# Patient Record
Sex: Female | Born: 1940 | Race: Black or African American | Hispanic: No | Smoking: Former smoker
Health system: Southern US, Community
[De-identification: ages and names within clinical notes are randomized; demographics above are authoritative.]

## PROBLEM LIST (undated history)

## (undated) DIAGNOSIS — Z972 Presence of dental prosthetic device (complete) (partial): Secondary | ICD-10-CM

## (undated) DIAGNOSIS — R519 Headache, unspecified: Secondary | ICD-10-CM

## (undated) DIAGNOSIS — Z9889 Other specified postprocedural states: Secondary | ICD-10-CM

## (undated) DIAGNOSIS — I10 Essential (primary) hypertension: Secondary | ICD-10-CM

## (undated) DIAGNOSIS — R112 Nausea with vomiting, unspecified: Secondary | ICD-10-CM

## (undated) DIAGNOSIS — E785 Hyperlipidemia, unspecified: Secondary | ICD-10-CM

## (undated) DIAGNOSIS — M1990 Unspecified osteoarthritis, unspecified site: Secondary | ICD-10-CM

## (undated) HISTORY — PX: COLONOSCOPY: SHX174

## (undated) HISTORY — PX: CATARACT EXTRACTION: SUR2

## (undated) HISTORY — DX: Hypercalcemia: E83.52

## (undated) HISTORY — PX: ABDOMINAL HYSTERECTOMY: SHX81

---

## 2017-04-29 ENCOUNTER — Other Ambulatory Visit: Payer: Self-pay | Admitting: Internal Medicine

## 2017-04-29 DIAGNOSIS — Z1231 Encounter for screening mammogram for malignant neoplasm of breast: Secondary | ICD-10-CM

## 2017-05-14 ENCOUNTER — Ambulatory Visit
Admission: RE | Admit: 2017-05-14 | Discharge: 2017-05-14 | Disposition: A | Discharge status: Home / Self Care | Payer: Medicare HMO | Source: Ambulatory Visit | Attending: Internal Medicine | Admitting: Internal Medicine

## 2017-05-14 DIAGNOSIS — Z1231 Encounter for screening mammogram for malignant neoplasm of breast: Secondary | ICD-10-CM | POA: Insufficient documentation

## 2017-06-12 ENCOUNTER — Other Ambulatory Visit: Payer: Self-pay | Admitting: *Deleted

## 2017-06-12 ENCOUNTER — Inpatient Hospital Stay
Admission: RE | Admit: 2017-06-12 | Discharge: 2017-06-12 | Disposition: A | Discharge status: Home / Self Care | Payer: Self-pay | Source: Ambulatory Visit | Attending: *Deleted | Admitting: *Deleted

## 2017-06-12 DIAGNOSIS — Z9289 Personal history of other medical treatment: Secondary | ICD-10-CM

## 2018-03-05 ENCOUNTER — Other Ambulatory Visit: Payer: Self-pay | Admitting: Internal Medicine

## 2018-03-05 DIAGNOSIS — Z1231 Encounter for screening mammogram for malignant neoplasm of breast: Secondary | ICD-10-CM

## 2018-08-06 ENCOUNTER — Ambulatory Visit
Admission: RE | Admit: 2018-08-06 | Discharge: 2018-08-06 | Disposition: A | Discharge status: Home / Self Care | Payer: Medicare HMO | Source: Ambulatory Visit | Attending: Internal Medicine | Admitting: Internal Medicine

## 2018-08-06 DIAGNOSIS — Z1231 Encounter for screening mammogram for malignant neoplasm of breast: Secondary | ICD-10-CM | POA: Diagnosis present

## 2019-09-21 ENCOUNTER — Other Ambulatory Visit: Payer: Self-pay | Admitting: Internal Medicine

## 2019-09-21 DIAGNOSIS — Z1231 Encounter for screening mammogram for malignant neoplasm of breast: Secondary | ICD-10-CM

## 2019-10-09 ENCOUNTER — Ambulatory Visit
Admission: RE | Admit: 2019-10-09 | Discharge: 2019-10-09 | Disposition: A | Discharge status: Home / Self Care | Payer: Medicare HMO | Source: Ambulatory Visit | Attending: Internal Medicine | Admitting: Internal Medicine

## 2019-10-09 DIAGNOSIS — Z1231 Encounter for screening mammogram for malignant neoplasm of breast: Secondary | ICD-10-CM | POA: Diagnosis not present

## 2020-02-29 ENCOUNTER — Other Ambulatory Visit: Payer: Self-pay

## 2020-02-29 ENCOUNTER — Encounter: Payer: Self-pay | Admitting: Ophthalmology

## 2020-03-01 LAB — EXTERNAL GENERIC LAB PROCEDURE: COLOGUARD: NEGATIVE

## 2020-03-01 LAB — COLOGUARD: COLOGUARD: NEGATIVE

## 2020-03-02 ENCOUNTER — Other Ambulatory Visit
Admission: RE | Admit: 2020-03-02 | Discharge: 2020-03-02 | Disposition: A | Discharge status: Home / Self Care | Payer: Medicare HMO | Source: Ambulatory Visit | Attending: Ophthalmology | Admitting: Ophthalmology

## 2020-03-02 ENCOUNTER — Other Ambulatory Visit: Payer: Self-pay

## 2020-03-02 DIAGNOSIS — Z20822 Contact with and (suspected) exposure to covid-19: Secondary | ICD-10-CM | POA: Insufficient documentation

## 2020-03-02 DIAGNOSIS — Z01812 Encounter for preprocedural laboratory examination: Secondary | ICD-10-CM | POA: Insufficient documentation

## 2020-03-02 LAB — SARS CORONAVIRUS 2 (TAT 6-24 HRS): SARS Coronavirus 2: NEGATIVE

## 2020-03-02 NOTE — Discharge Instructions (Signed)
INSTRUCTIONS FOLLOWING OCULOPLASTIC SURGERY AMY M. FOWLER, MD  AFTER YOUR EYE SURGERY, THER ARE MANY THINGS WHICH YOU, THE PATIENT, CAN DO TO ASSURE THE BEST POSSIBLE RESULT FROM YOUR OPERATION.  THIS SHEET SHOULD BE REFERRED TO WHENEVER QUESTIONS ARISE.  IF THERE ARE ANY QUESTIONS NOT ANSWERED HERE, DO NOT HESITATE TO CALL OUR OFFICE AT [phone] OR [phone].  THERE IS ALWAYS SOMEONE AVAILABLE TO CALL IF QUESTIONS OR PROBLEMS ARISE.  VISION: Your vision may be blurred and out of focus after surgery until you are able to stop using your ointment, swelling resolves and your eye(s) heal. This may take 1 to 2 weeks at the least.  If your vision becomes gradually more dim or dark, this is not normal and you need to call our office immediately.  EYE CARE: For the first 48 hours after surgery, use ice packs frequently - "20 minutes on, 20 minutes off" - to help reduce swelling and bruising.  Small bags of frozen peas or corn make good ice packs along with cloths soaked in ice water.  If you are wearing a patch or other type of dressing following surgery, keep this on for the amount of time specified by your doctor.  For the first week following surgery, you will need to treat your stitches with great care.  It is OK to shower, but take care to not allow soapy water to run into your eye(s) to help reduce chances of infection.  You may gently clean the eyelashes and around the eye(s) with cotton balls and sterile water, BUT DO NOT RUB THE STITCHES VIGOROUSLY.  Keeping your stitches moist with ointment will help promote healing with minimal scar formation.  ACTIVITY: When you leave the surgery center, you should go home, rest and be inactive.  The eye(s) may feel scratchy and keeping the eyes closed will allow for faster healing.  The first week following surgery, avoid straining (anything making the face turn red) or lifting over 20 pounds.  Additionally, avoid bending which causes your head to go below  your waist.  Using your eyes will NOT harm them, so feel free to read, watch television, use the computer, etc as desired.  Driving depends on each individual, so check with your doctor if you have questions about driving. Do not wear contact lenses for about 2 weeks.  Do not wear eye makeup for 2 weeks.  Avoid swimming, hot tubs, gardening, and dusting for 1 to 2 weeks to reduce the risk of an infection.  MEDICATIONS:  You will be given a prescription for an ointment to use 4 times a day on your stitches.  You can use the ointment in your eyes if they feel scratchy or irritated.  If you eyelid(s) don't close completely when you sleep, put some ointment in your eyes before bedtime.  EMERGENCY: If you experience SEVERE EYE PAIN OR HEADACHE UNRELIEVED BY TYLENOL OR TRAMADOL, NAUSEA OR VOMITING, WORSENING REDNESS, OR WORSENING VISION (ESPECIALLY VISION THAT WAS INITIALLY BETTER) CALL [phone] OR [phone] DURING BUSINESS HOURS OR AFTER HOURS.  General Anesthesia, Adult, Care After This sheet gives you information about how to care for yourself after your procedure. Your health care provider may also give you more specific instructions. If you have problems or questions, contact your health care provider. What can I expect after the procedure? After the procedure, the following side effects are common:  Pain or discomfort at the IV site.  Nausea.  Vomiting.  Sore throat.  Trouble concentrating.    Feeling cold or chills.  Weak or tired.  Sleepiness and fatigue.  Soreness and body aches. These side effects can affect parts of the body that were not involved in surgery. Follow these instructions at home:  For at least 24 hours after the procedure:  Have a responsible adult stay with you. It is important to have someone help care for you until you are awake and alert.  Rest as needed.  Do not: ? Participate in activities in which you could fall or become injured. ? Drive. ? Use  heavy machinery. ? Drink alcohol. ? Take sleeping pills or medicines that cause drowsiness. ? Make important decisions or sign legal documents. ? Take care of children on your own. Eating and drinking  Follow any instructions from your health care provider about eating or drinking restrictions.  When you feel hungry, start by eating small amounts of foods that are soft and easy to digest (bland), such as toast. Gradually return to your regular diet.  Drink enough fluid to keep your urine pale yellow.  If you vomit, rehydrate by drinking water, juice, or clear broth. General instructions  If you have sleep apnea, surgery and certain medicines can increase your risk for breathing problems. Follow instructions from your health care provider about wearing your sleep device: ? Anytime you are sleeping, including during daytime naps. ? While taking prescription pain medicines, sleeping medicines, or medicines that make you drowsy.  Return to your normal activities as told by your health care provider. Ask your health care provider what activities are safe for you.  Take over-the-counter and prescription medicines only as told by your health care provider.  If you smoke, do not smoke without supervision.  Keep all follow-up visits as told by your health care provider. This is important. Contact a health care provider if:  You have nausea or vomiting that does not get better with medicine.  You cannot eat or drink without vomiting.  You have pain that does not get better with medicine.  You are unable to pass urine.  You develop a skin rash.  You have a fever.  You have redness around your IV site that gets worse. Get help right away if:  You have difficulty breathing.  You have chest pain.  You have blood in your urine or stool, or you vomit blood. Summary  After the procedure, it is common to have a sore throat or nausea. It is also common to feel tired.  Have a  responsible adult stay with you for the first 24 hours after general anesthesia. It is important to have someone help care for you until you are awake and alert.  When you feel hungry, start by eating small amounts of foods that are soft and easy to digest (bland), such as toast. Gradually return to your regular diet.  Drink enough fluid to keep your urine pale yellow.  Return to your normal activities as told by your health care provider. Ask your health care provider what activities are safe for you. This information is not intended to replace advice given to you by your health care provider. Make sure you discuss any questions you have with your health care provider. Document Revised: 07/05/2017 Document Reviewed: 02/15/2017 Elsevier Patient Education  2020 [employer]  

## 2020-03-04 ENCOUNTER — Ambulatory Visit: Payer: Medicare HMO | Admitting: Anesthesiology

## 2020-03-04 ENCOUNTER — Encounter
Admission: RE | Disposition: A | Discharge status: Home / Self Care | Payer: Self-pay | Source: Home / Self Care | Attending: Ophthalmology

## 2020-03-04 ENCOUNTER — Encounter: Payer: Self-pay | Admitting: Ophthalmology

## 2020-03-04 ENCOUNTER — Ambulatory Visit
Admission: RE | Admit: 2020-03-04 | Discharge: 2020-03-04 | Disposition: A | Discharge status: Home / Self Care | Payer: Medicare HMO | Attending: Ophthalmology | Admitting: Ophthalmology

## 2020-03-04 ENCOUNTER — Other Ambulatory Visit: Payer: Self-pay

## 2020-03-04 DIAGNOSIS — I10 Essential (primary) hypertension: Secondary | ICD-10-CM | POA: Insufficient documentation

## 2020-03-04 DIAGNOSIS — E7800 Pure hypercholesterolemia, unspecified: Secondary | ICD-10-CM | POA: Insufficient documentation

## 2020-03-04 DIAGNOSIS — H11821 Conjunctivochalasis, right eye: Secondary | ICD-10-CM | POA: Diagnosis not present

## 2020-03-04 DIAGNOSIS — Z79899 Other long term (current) drug therapy: Secondary | ICD-10-CM | POA: Insufficient documentation

## 2020-03-04 DIAGNOSIS — Z87891 Personal history of nicotine dependence: Secondary | ICD-10-CM | POA: Diagnosis not present

## 2020-03-04 DIAGNOSIS — M1990 Unspecified osteoarthritis, unspecified site: Secondary | ICD-10-CM | POA: Diagnosis not present

## 2020-03-04 DIAGNOSIS — H02102 Unspecified ectropion of right lower eyelid: Secondary | ICD-10-CM | POA: Diagnosis present

## 2020-03-04 HISTORY — DX: Unspecified osteoarthritis, unspecified site: M19.90

## 2020-03-04 HISTORY — DX: Other specified postprocedural states: Z98.890

## 2020-03-04 HISTORY — DX: Nausea with vomiting, unspecified: R11.2

## 2020-03-04 HISTORY — DX: Presence of dental prosthetic device (complete) (partial): Z97.2

## 2020-03-04 HISTORY — DX: Hyperlipidemia, unspecified: E78.5

## 2020-03-04 HISTORY — DX: Essential (primary) hypertension: I10

## 2020-03-04 HISTORY — PX: ECTROPION REPAIR: SHX357

## 2020-03-04 SURGERY — REPAIR, ECTROPION, EYELID
Anesthesia: Monitor Anesthesia Care | Site: Eye | Laterality: Right

## 2020-03-04 MED ORDER — TRAMADOL HCL 50 MG PO TABS: ORAL_TABLET | ORAL | 0 refills | Status: DC

## 2020-03-04 MED ORDER — ERYTHROMYCIN 5 MG/GM OP OINT
TOPICAL_OINTMENT | OPHTHALMIC | Status: DC | PRN
  Administered 2020-03-04: 1 via OPHTHALMIC

## 2020-03-04 MED ORDER — MOXIFLOXACIN HCL 0.5 % OP SOLN: 1.0000 [drp] | Freq: Three times a day (TID) | OPHTHALMIC | 0 refills | Status: AC

## 2020-03-04 MED ORDER — ACETAMINOPHEN 325 MG PO TABS: 325.0000 mg | ORAL_TABLET | Freq: Once | ORAL | Status: DC

## 2020-03-04 MED ORDER — ACETAMINOPHEN 160 MG/5ML PO SOLN: 325.0000 mg | Freq: Once | ORAL | Status: DC

## 2020-03-04 MED ORDER — LIDOCAINE HCL (CARDIAC) PF 100 MG/5ML IV SOSY
PREFILLED_SYRINGE | INTRAVENOUS | Status: DC | PRN
  Administered 2020-03-04: 40 mg via INTRAVENOUS

## 2020-03-04 MED ORDER — PROPOFOL 500 MG/50ML IV EMUL
INTRAVENOUS | Status: DC | PRN
  Administered 2020-03-04: 25 ug/kg/min via INTRAVENOUS

## 2020-03-04 MED ORDER — LIDOCAINE-EPINEPHRINE 2 %-1:100000 IJ SOLN
INTRAMUSCULAR | Status: DC | PRN
  Administered 2020-03-04: 1.5 mL via OPHTHALMIC
  Administered 2020-03-04: 1 mL via OPHTHALMIC

## 2020-03-04 MED ORDER — LACTATED RINGERS IV SOLN: INTRAVENOUS | Status: DC

## 2020-03-04 MED ORDER — ONDANSETRON HCL 4 MG/2ML IJ SOLN
INTRAMUSCULAR | Status: DC | PRN
  Administered 2020-03-04: 4 mg via INTRAVENOUS

## 2020-03-04 MED ORDER — ERYTHROMYCIN 5 MG/GM OP OINT: TOPICAL_OINTMENT | OPHTHALMIC | 2 refills | Status: DC

## 2020-03-04 MED ORDER — BSS IO SOLN
INTRAOCULAR | Status: DC | PRN
  Administered 2020-03-04: 2 mL

## 2020-03-04 MED ORDER — TETRACAINE HCL 0.5 % OP SOLN
OPHTHALMIC | Status: DC | PRN
  Administered 2020-03-04: 2 [drp] via OPHTHALMIC

## 2020-03-04 MED ORDER — MIDAZOLAM HCL 2 MG/2ML IJ SOLN
INTRAMUSCULAR | Status: DC | PRN
  Administered 2020-03-04: 1 mg via INTRAVENOUS

## 2020-03-04 MED ORDER — ALFENTANIL 500 MCG/ML IJ INJ
INJECTION | INTRAVENOUS | Status: DC | PRN
Start: 2020-03-04 — End: 2020-03-04
  Administered 2020-03-04: 500 ug via INTRAVENOUS

## 2020-03-04 SURGICAL SUPPLY — 36 items
APPLICATOR COTTON TIP WD 3 STR (MISCELLANEOUS) ×6 IMPLANT
BLADE SURG 15 STRL LF DISP TIS (BLADE) ×1 IMPLANT
BLADE SURG 15 STRL SS (BLADE) ×3
CORD BIP STRL DISP 12FT (MISCELLANEOUS) ×3 IMPLANT
DRAPE HEAD BAR (DRAPES) ×3 IMPLANT
GAUZE SPONGE 4X4 12PLY STRL (GAUZE/BANDAGES/DRESSINGS) ×3 IMPLANT
GLOVE SURG LX 7.0 MICRO (GLOVE) ×4
GLOVE SURG LX STRL 7.0 MICRO (GLOVE) ×2 IMPLANT
MARKER SKIN XFINE TIP W/RULER (MISCELLANEOUS) ×3 IMPLANT
NEEDLE FILTER BLUNT 18X 1/2SAF (NEEDLE) ×2
NEEDLE FILTER BLUNT 18X1 1/2 (NEEDLE) ×1 IMPLANT
NEEDLE HYPO 30X.5 LL (NEEDLE) ×6 IMPLANT
PACK ENT CUSTOM (PACKS) ×3 IMPLANT
SOL PREP PVP 2OZ (MISCELLANEOUS) ×3
SOLUTION PREP PVP 2OZ (MISCELLANEOUS) ×1 IMPLANT
SPONGE GAUZE 2X2 8PLY STER LF (GAUZE/BANDAGES/DRESSINGS) ×10
SPONGE GAUZE 2X2 8PLY STRL LF (GAUZE/BANDAGES/DRESSINGS) ×20 IMPLANT
SUT CHROMIC 4-0 (SUTURE)
SUT CHROMIC 4-0 M2 12X2 ARM (SUTURE)
SUT CHROMIC 5 0 P 3 (SUTURE) IMPLANT
SUT ETHILON 4 0 CL P 3 (SUTURE) IMPLANT
SUT GUT PLAIN 6-0 1X18 ABS (SUTURE) ×3 IMPLANT
SUT MERSILENE 4-0 S-2 (SUTURE) ×3 IMPLANT
SUT PDS AB 4-0 P3 18 (SUTURE) IMPLANT
SUT PROLENE 5 0 P 3 (SUTURE) IMPLANT
SUT PROLENE 6 0 P 1 18 (SUTURE) IMPLANT
SUT SILK 4 0 G 3 (SUTURE) IMPLANT
SUT VIC AB 5-0 P-3 18X BRD (SUTURE) IMPLANT
SUT VIC AB 5-0 P3 18 (SUTURE)
SUT VICRYL 6-0  S14 CTD (SUTURE)
SUT VICRYL 6-0 S14 CTD (SUTURE) IMPLANT
SUT VICRYL 7 0 TG140 8 (SUTURE) IMPLANT
SUTURE CHRMC 4-0 M2 12X2 ARM (SUTURE) IMPLANT
SYR 10ML LL (SYRINGE) ×3 IMPLANT
SYR 3ML LL SCALE MARK (SYRINGE) ×3 IMPLANT
WATER STERILE IRR 250ML POUR (IV SOLUTION) ×3 IMPLANT

## 2020-03-04 NOTE — Transfer of Care (Signed)
Immediate Anesthesia Transfer of Care Note  Patient: Cristina Martinez  Procedure(s) Performed: ECTROPION REPAIR, EXTENSIVE RIGHT EYE CONJUNCTIVOPLASTY RIGHT EYE HIGH TEMP HANDHELD CAUTERY (Right Eye)  Patient Location: PACU  Anesthesia Type: MAC  Level of Consciousness: awake, alert  and patient cooperative  Airway and Oxygen Therapy: Patient Spontanous Breathing and Patient connected to supplemental oxygen  Post-op Assessment: Post-op Vital signs reviewed, Patient's Cardiovascular Status Stable, Respiratory Function Stable, Patent Airway and No signs of Nausea or vomiting  Post-op Vital Signs: Reviewed and stable  Complications: No complications documented.

## 2020-03-04 NOTE — Anesthesia Postprocedure Evaluation (Signed)
Anesthesia Post Note  Patient: Cristina Martinez  Procedure(s) Performed: ECTROPION REPAIR, EXTENSIVE RIGHT EYE CONJUNCTIVOPLASTY RIGHT EYE HIGH TEMP HANDHELD CAUTERY (Right Eye)     Patient location during evaluation: PACU Anesthesia Type: MAC Level of consciousness: awake and alert and oriented Pain management: satisfactory to patient Vital Signs Assessment: post-procedure vital signs reviewed and stable Respiratory status: spontaneous breathing, nonlabored ventilation and respiratory function stable Cardiovascular status: blood pressure returned to baseline and stable Postop Assessment: Adequate PO intake and No signs of nausea or vomiting Anesthetic complications: no   No complications documented.  Raliegh Ip

## 2020-03-04 NOTE — Op Note (Signed)
Preoperative Diagnosis:   1.  Lower eyelid laxity with ectropion, right   lower eyelid(s). 2.  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  Procedure(s) Performed:  1.  Lateral tarsal strip procedure,  right  lower eyelid(s). 2.  Right conjunctivoplasty  Surgeon: Philis Pique. [name], M.D.  Assistants: none  Anesthesia: MAC  Specimens: None.  Estimated Blood Loss: Minimal.  Complications: None.  Operative Findings: None   Procedure:   Allergies were reviewed and the patient Valium [diazepam].    After discussing the risks, benefits, complications, and alternatives with the patient, appropriate informed consent was obtained. The patient was brought to the operating suite and reclined supine. Time out was conducted and the patient was sedated.  Local anesthetic consisting of a 50-50 mixture of 2% lidocaine with epinephrine and 0.75% bupivacaine with added Hylenex was injected subcutaneously to the right  lateral canthal region(s) and lower eyelid(s). Additional anesthetic was injected subconjunctivally to the right  lower eyelid(s). Finally, anesthetic was injected down to the periosteum of the right  lateral orbital rim(s).  After adequate local was instilled, the patient was prepped and draped in the usual sterile fashion for eyelid surgery. Attention was turned to the right  lateral canthal angle. Westcott scissors were used to create a lateral canthotomy. Hemostasis was obtained with bipolar cautery. An inferior cantholysis was then performed with additional bipolar hemostasis. The anterior and posterior lamella of the lid were divided for approximately 8 mm.  A strip of the epithelium was excised off the superior margin of the tarsal strip and conjunctiva and retractors were incised off the inferior margin of the tarsal strip.   A lid speculum was placed in the right eye.  Additional subconjunctival anesthetic was injected to the  medial bulbar conjunctiva to balloon it up.  Hightemperature handheld cautery was used to lay out a grid of thermal spots to shrink back the conjunctiva.  Westcott scissors were used to debulk the caruncle.  A double-armed 4-0 [place] suture was then passed each arm through the terminal portion of the tarsal strip. Each arm of the suture was then passed through the periosteum of the inner portion of the lateral orbital rim at the level of Whitnall's tubercle. The sutures were advanced and this provided nice elevation and tightening of the lower eyelid. Once the suture was secured, a thin strip of follicle-bearing skin was excised. The lateral canthal angle was reformed with an interrupted 6-0 plain suture. Orbicularis was reapproximated with horizontal subcuticular 6-0 fast absorbing plain gut sutures. The skin was closed with interrupted 6-0 plain gut sutures.   Attention was then turned to the opposite eyelid where the same procedure was performed in the same manner.   The patient tolerated the procedure well. Erythromycin ophthalmic ointment was applied to the incision site(s) followed by ice packs. The patient was taken to the recovery area where she recovered without difficulty.  Post-Op Plan/Instructions:  The patient was instructed to use ice packs frequently for the next 48 hours. She was instructed to use Erythromycin ophthalmic ointment on her incisions 4 times a day and vigamox drops 3 times a day in the right eye for the next 12 to 14 days. She was given a prescription for tramadol (or similar) for pain control should Tylenol not be effective. She was asked to to follow up at the [facility] in [place], [place] in 2-3 weeks' time or sooner as needed for problems.  Othon Guardia M.  [name], M.D. Ophthalmology

## 2020-03-04 NOTE — Anesthesia Procedure Notes (Signed)
Procedure Name: MAC Date/Time: 03/04/2020 10:37 AM Performed by: Silvana Newness, CRNA Pre-anesthesia Checklist: Patient identified, Emergency Drugs available, Suction available, Patient being monitored and Timeout performed Patient Re-evaluated:Patient Re-evaluated prior to induction Oxygen Delivery Method: Nasal cannula Placement Confirmation: positive ETCO2

## 2020-03-04 NOTE — Anesthesia Preprocedure Evaluation (Signed)
Anesthesia Evaluation  Patient identified by MRN, date of birth, ID band Patient awake    Reviewed: Allergy & Precautions, H&P , NPO status , Patient's Chart, lab work & pertinent test results  Airway Mallampati: II  TM Distance: >3 FB Neck ROM: full    Dental no notable dental hx.    Pulmonary former smoker,    Pulmonary exam normal breath sounds clear to auscultation       Cardiovascular hypertension, Normal cardiovascular exam Rhythm:regular Rate:Normal     Neuro/Psych    GI/Hepatic   Endo/Other    Renal/GU      Musculoskeletal   Abdominal   Peds  Hematology   Anesthesia Other Findings   Reproductive/Obstetrics                             Anesthesia Physical Anesthesia Plan  ASA: II  Anesthesia Plan: MAC   Post-op Pain Management:    Induction:   PONV Risk Score and Plan: 2 and Treatment may vary due to age or medical condition, TIVA, Midazolam and Propofol infusion  Airway Management Planned:   Additional Equipment:   Intra-op Plan:   Post-operative Plan:   Informed Consent: I have reviewed the patients History and Physical, chart, labs and discussed the procedure including the risks, benefits and alternatives for the proposed anesthesia with the patient or authorized representative who has indicated his/her understanding and acceptance.     Dental Advisory Given  Plan Discussed with: CRNA  Anesthesia Plan Comments:         Anesthesia Quick Evaluation

## 2020-03-04 NOTE — H&P (Signed)
See the history and physical completed at [facility] on 02/17/20 and scanned into the chart.

## 2020-03-04 NOTE — Interval H&P Note (Signed)
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  The various methods of treatment have been discussed with the patient and family. After consideration of risks, benefits and other options for treatment, the patient has consented to  Procedure(s): ECTROPION REPAIR, EXTENSIVE RIGHT EYE CONJUNCTIVOPLASTY RIGHT EYE HIGH TEMP HANDHELD CAUTERY (Right) as a surgical intervention.  The patient's history has been reviewed, patient examined, no change in status, stable for surgery.  I have reviewed the patient's chart and labs.  Questions were answered to the patient's satisfaction.     [name], Scarleth Brame M

## 2020-03-07 ENCOUNTER — Encounter: Payer: Self-pay | Admitting: Ophthalmology

## 2020-03-07 NOTE — Addendum Note (Signed)
Addendum  created 03/07/20 1222 by Ronelle Nigh, MD   Intraprocedure Event edited

## 2020-08-04 ENCOUNTER — Encounter: Payer: Self-pay | Admitting: General Surgery

## 2020-08-04 ENCOUNTER — Ambulatory Visit (INDEPENDENT_AMBULATORY_CARE_PROVIDER_SITE_OTHER): Payer: Medicare HMO | Admitting: General Surgery

## 2020-08-04 ENCOUNTER — Other Ambulatory Visit: Payer: Self-pay

## 2020-08-04 VITALS — BP 146/77 | HR 82 | Temp 98.4°F | Resp 98 | Ht 65.0 in | Wt 151.0 lb

## 2020-08-04 DIAGNOSIS — E213 Hyperparathyroidism, unspecified: Secondary | ICD-10-CM

## 2020-08-04 NOTE — Progress Notes (Signed)
Patient ID: Cristina Martinez, female   DOB: [birth year], 80 y.o.   MRN: [id number]  Chief Complaint  Patient presents with  . New Patient (Initial Visit)    Hyperparathyroid     HPI Cristina Martinez is a 80 y.o. female.  She has been referred by Dr. Gabriel Carina for surgical evaluation of hyperparathyroidism.  Ms. 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  At that time, she says she was told to just continue to observe.  She was referred to see Dr. Gabriel Carina in March 2021 after routine laboratory studies demonstrated an elevated calcium of 10.8.  She denies any history of nephrolithiasis or pancreatitis.  She denies polydipsia or polyuria.  No history of pathologic fracture, although she fractured an ankle in the 70s after a fall and her shoulder in the 80s after a fall.  She denies foggy thinking or memory changes.  No sleep disturbances.  No pruritus.  No long bone pain or unusual arthralgias.  No history of thyroid disorders or head and neck irradiation.  No family history of multiple endocrine neoplasia, hypercalcemia, or jaw tumors.  She does take thiazide diuretics, but has not taken calcium supplements, Tums/Rolaids, milk of magnesia, or vitamin supplements.  Dr. Gabriel Carina had the patient perform multiple 24-hour urine collections, but apparently the patient never stopped her hydrochlorothiazide prior to obtaining the samples and therefore the 24-hour urine calcium has been low on each occasion.  CaSR mutation has not been checked.  She had a bone mineral density study done in 2020 that showed osteoporosis with a T score of -2.5 in the lumbar spine, femoral neck was -2.6 and left total hip score of -2.3, consistent with osteoporosis.  Nondominant forearm was not evaluated.  Dr. Gabriel Carina performed an ultrasound that was consistent with a right-sided parathyroid candidate.   Past Medical History:  Diagnosis Date  .  Arthritis   . Hypercalcemia   . Hyperlipidemia   . Hypertension   . PONV (postoperative nausea and vomiting)   . Wears dentures    full upper, partial lower    Past Surgical History:  Procedure Laterality Date  . ABDOMINAL HYSTERECTOMY    . CATARACT EXTRACTION    . COLONOSCOPY    . ECTROPION REPAIR Right 03/04/2020   Procedure: ECTROPION REPAIR, EXTENSIVE RIGHT EYE CONJUNCTIVOPLASTY RIGHT EYE HIGH TEMP HANDHELD CAUTERY;  Surgeon: Karle Starch, MD;  Location: [facility];  Service: Ophthalmology;  Laterality: Right;    Family History  Problem Relation Age of Onset  . Breast cancer Sister   . Heart failure Mother   . Stomach cancer Father   . Breast cancer Sister     Social History Social History   Tobacco Use  . Smoking status: Former Smoker    Quit date: 1995    Years since quitting: 27.0  . Smokeless tobacco: Never Used  Vaping Use  . Vaping Use: Never used  Substance Use Topics  . Alcohol use: Not Currently    Allergies  Allergen Reactions  . Valium [Diazepam] Nausea And Vomiting    Current Outpatient Medications  Medication Sig Dispense Refill  . alendronate (FOSAMAX) 70 MG tablet Take 70 mg by mouth once a week.    Marland Kitchen amLODipine (NORVASC) 10 MG tablet Take 10 mg by mouth daily.    . ASPIRIN 81 PO Take by mouth daily.    Marland Kitchen losartan-hydrochlorothiazide (HYZAAR) 100-25 MG  tablet Take 1 tablet by mouth daily.    . potassium chloride (KLOR-CON) 10 MEQ tablet Take 10 mEq by mouth 3 (three) times a week.     . simvastatin (ZOCOR) 20 MG tablet Take 20 mg by mouth daily.    . Vitamin D, Ergocalciferol, (DRISDOL) 1.25 MG (50000 UNIT) CAPS capsule Take 50,000 Units by mouth every 7 (seven) days.     No current facility-administered medications for this visit.    Review of Systems Review of Systems  All other systems reviewed and are negative. Or as discussed in the history of present illness.  Blood pressure (!) 146/77, pulse 82, temperature 98.4 F  (36.9 C), resp. rate (!) 98, height '5\' 5"'  (1.651 m), weight 151 lb (68.5 kg).  Physical Exam Physical Exam Constitutional:      General: She is not in acute distress.    Appearance: Normal appearance. She is normal weight.  HENT:     Head: Normocephalic and atraumatic.     Nose:     Comments: Covered with a mask    Mouth/Throat:     Comments: Covered with a mask Eyes:     General: No scleral icterus.       Right eye: No discharge.        Left eye: No discharge.  Neck:     Comments: The trachea is midline.  There is no palpable cervical or supraclavicular lymphadenopathy.  No thyromegaly or dominant thyroid masses appreciated.  The gland moves freely with deglutition. Cardiovascular:     Rate and Rhythm: Normal rate and regular rhythm.     Pulses: Normal pulses.  Pulmonary:     Effort: Pulmonary effort is normal.     Breath sounds: Normal breath sounds.  Abdominal:     General: Bowel sounds are normal.     Palpations: Abdomen is soft.  Genitourinary:    Comments: Deferred Musculoskeletal:        General: No swelling or deformity.  Skin:    General: Skin is warm and dry.  Neurological:     General: No focal deficit present.     Mental Status: She is alert and oriented to person, place, and time.  Psychiatric:        Mood and Affect: Mood normal.        Behavior: Behavior normal.     Data Reviewed Via the electronic medical record, I was able to review the patient's laboratory studies.  This includes the multiple 24-hour urine studies for calcium that all demonstrated low urinary calcium, in the presence of hydrochlorothiazide use. These labs also demonstrate that her vitamin D levels are normal as of July 19, 2020.Marland Kitchen  Her calcium has ranged as high as 11.3 mg/dL and her GFR is decreased at 58 on her most recent labs.  Intact PTH was 70 at the time of her calcium of 11.3 (07/19/2020).  SPEP and UPEP were negative.  I reviewed Dr. Joycie Peek notes via the electronic medical  record as well as the data that were faxed to my office.  These also included the report from her bone densitometry study done on 05/12/2019, as discussed in the history of present illness.  Although I was unable to review the actual images, I reviewed the ultrasound findings from the study performed on October 09, 2019.  I did place a probe on the patient's neck today and confirmed the presence of the right midpole cystic nodule as well as the right sided parathyroid candidate.  Assessment  This is a 80 year old woman with hypercalcemia and biochemical findings that are most consistent with primary hyperparathyroidism.  Unfortunately, her 24-hour urine collections have all been compromised by hydrochlorothiazide use and/or inadequate dietary calcium intake.  CaSR has not been evaluated, but the most likely culprit in this woman's case is primary hyperparathyroidism.  She does have a right-sided parathyroid candidate visible on ultrasound.  She meets criteria for surgery based upon her osteoporosis, degree of serum calcium elevation, and compromised eGFR.  Plan I have offered the patient a minimally invasive parathyroidectomy.The risks of parathyroid surgery were discussed with the patient, including (but not limited to): bleeding, infection, damage to surrounding structures/tissues, injury (temporary or permanent) to the recurrent laryngeal nerve, hypocalcemia (temporary or permanent), need to take calcium and/or vitamin D supplementation, recurrent hyperparathyroidism, failure to correct hyperparathyroidism, need for additional surgery.  The patient had the opportunity to ask any questions and these were answered to their satisfaction.   In addition, I sent an email to Dr. Gabriel Carina to confirm that she felt that the low urinary calcium levels could all be attributed to her thiazide diuretic use and that no mutation testing was indicated.  I will await her reply, but in the meantime, I think we can safely  schedule Ms. Albea for outpatient parathyroidectomy.    [name] 08/04/2020, 12:27 PM

## 2020-08-04 NOTE — H&P (View-Only) (Signed)
Patient ID: Cristina Martinez, female   DOB: [birth year], 80 y.o.   MRN: [id number]  Chief Complaint  Patient presents with  . New Patient (Initial Visit)    Hyperparathyroid     HPI Cristina Martinez is a 80 y.o. female.  She has been referred by Dr. Gabriel Carina for surgical evaluation of hyperparathyroidism.  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  At that time, she says she was told to just continue to observe.  She was referred to see Dr. Gabriel Carina in March 2021 after routine laboratory studies demonstrated an elevated calcium of 10.8.  She denies any history of nephrolithiasis or pancreatitis.  She denies polydipsia or polyuria.  No history of pathologic fracture, although she fractured an ankle in the 70s after a fall and her shoulder in the 80s after a fall.  She denies foggy thinking or memory changes.  No sleep disturbances.  No pruritus.  No long bone pain or unusual arthralgias.  No history of thyroid disorders or head and neck irradiation.  No family history of multiple endocrine neoplasia, hypercalcemia, or jaw tumors.  She does take thiazide diuretics, but has not taken calcium supplements, Tums/Rolaids, milk of magnesia, or vitamin supplements.  Dr. Gabriel Carina had the patient perform multiple 24-hour urine collections, but apparently the patient never stopped her hydrochlorothiazide prior to obtaining the samples and therefore the 24-hour urine calcium has been low on each occasion.  CaSR mutation has not been checked.  She had a bone mineral density study done in 2020 that showed osteoporosis with a T score of -2.5 in the lumbar spine, femoral neck was -2.6 and left total hip score of -2.3, consistent with osteoporosis.  Nondominant forearm was not evaluated.  Dr. Gabriel Carina performed an ultrasound that was consistent with a right-sided parathyroid candidate.   Past Medical History:  Diagnosis Date  .  Arthritis   . Hypercalcemia   . Hyperlipidemia   . Hypertension   . PONV (postoperative nausea and vomiting)   . Wears dentures    full upper, partial lower    Past Surgical History:  Procedure Laterality Date  . ABDOMINAL HYSTERECTOMY    . CATARACT EXTRACTION    . COLONOSCOPY    . ECTROPION REPAIR Right 03/04/2020   Procedure: ECTROPION REPAIR, EXTENSIVE RIGHT EYE CONJUNCTIVOPLASTY RIGHT EYE HIGH TEMP HANDHELD CAUTERY;  Surgeon: Karle Starch, MD;  Location: [facility];  Service: Ophthalmology;  Laterality: Right;    Family History  Problem Relation Age of Onset  . Breast cancer Sister   . Heart failure Mother   . Stomach cancer Father   . Breast cancer Sister     Social History Social History   Tobacco Use  . Smoking status: Former Smoker    Quit date: 1995    Years since quitting: 27.0  . Smokeless tobacco: Never Used  Vaping Use  . Vaping Use: Never used  Substance Use Topics  . Alcohol use: Not Currently    Allergies  Allergen Reactions  . Valium [Diazepam] Nausea And Vomiting    Current Outpatient Medications  Medication Sig Dispense Refill  . alendronate (FOSAMAX) 70 MG tablet Take 70 mg by mouth once a week.    Marland Kitchen amLODipine (NORVASC) 10 MG tablet Take 10 mg by mouth daily.    . ASPIRIN 81 PO Take by mouth daily.    Marland Kitchen losartan-hydrochlorothiazide (HYZAAR) 100-25 MG  tablet Take 1 tablet by mouth daily.    . potassium chloride (KLOR-CON) 10 MEQ tablet Take 10 mEq by mouth 3 (three) times a week.     . simvastatin (ZOCOR) 20 MG tablet Take 20 mg by mouth daily.    . Vitamin D, Ergocalciferol, (DRISDOL) 1.25 MG (50000 UNIT) CAPS capsule Take 50,000 Units by mouth every 7 (seven) days.     No current facility-administered medications for this visit.    Review of Systems Review of Systems  All other systems reviewed and are negative. Or as discussed in the history of present illness.  Blood pressure (!) 146/77, pulse 82, temperature 98.4 F  (36.9 C), resp. rate (!) 98, height '5\' 5"'  (1.651 m), weight 151 lb (68.5 kg).  Physical Exam Physical Exam Constitutional:      General: She is not in acute distress.    Appearance: Normal appearance. She is normal weight.  HENT:     Head: Normocephalic and atraumatic.     Nose:     Comments: Covered with a mask    Mouth/Throat:     Comments: Covered with a mask Eyes:     General: No scleral icterus.       Right eye: No discharge.        Left eye: No discharge.  Neck:     Comments: The trachea is midline.  There is no palpable cervical or supraclavicular lymphadenopathy.  No thyromegaly or dominant thyroid masses appreciated.  The gland moves freely with deglutition. Cardiovascular:     Rate and Rhythm: Normal rate and regular rhythm.     Pulses: Normal pulses.  Pulmonary:     Effort: Pulmonary effort is normal.     Breath sounds: Normal breath sounds.  Abdominal:     General: Bowel sounds are normal.     Palpations: Abdomen is soft.  Genitourinary:    Comments: Deferred Musculoskeletal:        General: No swelling or deformity.  Skin:    General: Skin is warm and dry.  Neurological:     General: No focal deficit present.     Mental Status: She is alert and oriented to person, place, and time.  Psychiatric:        Mood and Affect: Mood normal.        Behavior: Behavior normal.     Data Reviewed Via the electronic medical record, I was able to review the patient's laboratory studies.  This includes the multiple 24-hour urine studies for calcium that all demonstrated low urinary calcium, in the presence of hydrochlorothiazide use. These labs also demonstrate that her vitamin D levels are normal as of July 19, 2020.Marland Kitchen  Her calcium has ranged as high as 11.3 mg/dL and her GFR is decreased at 58 on her most recent labs.  Intact PTH was 70 at the time of her calcium of 11.3 (07/19/2020).  SPEP and UPEP were negative.  I reviewed Dr. Joycie Peek notes via the electronic medical  record as well as the data that were faxed to my office.  These also included the report from her bone densitometry study done on 05/12/2019, as discussed in the history of present illness.  Although I was unable to review the actual images, I reviewed the ultrasound findings from the study performed on October 09, 2019.  I did place a probe on the patient's neck today and confirmed the presence of the right midpole cystic nodule as well as the right sided parathyroid candidate.  Assessment  This is a 80 year old woman with hypercalcemia and biochemical findings that are most consistent with primary hyperparathyroidism.  Unfortunately, her 24-hour urine collections have all been compromised by hydrochlorothiazide use and/or inadequate dietary calcium intake.  CaSR has not been evaluated, but the most likely culprit in this woman's case is primary hyperparathyroidism.  She does have a right-sided parathyroid candidate visible on ultrasound.  She meets criteria for surgery based upon her osteoporosis, degree of serum calcium elevation, and compromised eGFR.  Plan I have offered the patient a minimally invasive parathyroidectomy.The risks of parathyroid surgery were discussed with the patient, including (but not limited to): bleeding, infection, damage to surrounding structures/tissues, injury (temporary or permanent) to the recurrent laryngeal nerve, hypocalcemia (temporary or permanent), need to take calcium and/or vitamin D supplementation, recurrent hyperparathyroidism, failure to correct hyperparathyroidism, need for additional surgery.  The patient had the opportunity to ask any questions and these were answered to their satisfaction.   In addition, I sent an email to Dr. Gabriel Carina to confirm that she felt that the low urinary calcium levels could all be attributed to her thiazide diuretic use and that no mutation testing was indicated.  I will await her reply, but in the meantime, I think we can safely  schedule Cristina Martinez for outpatient parathyroidectomy.    [name] 08/04/2020, 12:27 PM

## 2020-08-04 NOTE — Patient Instructions (Addendum)
We have spoken to you about having your Parathyroid removed. We are waiting on more information from Dr Gabriel Carina and my order some lab work based on what she says. In the meantime we will go ahead and schedule you for surgery. Please see your Blue surgery sheet for information. Our surgery scheduler will contact you in the next few days.   Parathyroidectomy  A parathyroidectomy is a surgery to remove one or more parathyroid glands. These glands are in the neck. Each gland is very small, about the size of a pea. Most people have four parathyroid glands. The glands produce parathyroid hormone, which helps to control the level of calcium in the body. You may have a parathyroidectomy if your body produces too much parathyroid hormone (hyperparathyroidism). This usually occurs when one or more of your parathyroid glands becomes enlarged from a type of noncancerous tumor (adenoma). Tell a health care provider about:  Any allergies you have.  All medicines you are taking, including vitamins, herbs, eye drops, creams, and over-the-counter medicines.  Any problems you or family members have had with anesthetic medicines.  Any blood disorders you have.  Any surgeries you have had.  Any medical conditions you have.  Whether you are pregnant or may be pregnant. What are the risks? Generally, this is a safe procedure. However, problems may occur, including:  Bleeding.  Infection.  Allergic reactions to medicines.  Damage to the nerves of your voice box (larynx). This can be temporary or long-term (rare).  Damage to nearby structures and organs, such as the skin (scarring), surrounding blood vessels, and nerves in the neck.  Hoarseness. This usually resolves in 24-48 hours.  A condition in which your body does not make enough parathyroid hormone (hypoparathyroidism). This is rare.  Difficulty breathing. This is rare. What happens before the procedure? Staying hydrated  Medicines Ask your  health care provider about:  Changing or stopping your regular medicines. This is especially important if you are taking diabetes medicines or blood thinners.  Taking medicines such as aspirin and ibuprofen. These medicines can thin your blood. Do not take these medicines unless your health care provider tells you to take them.  Taking over-the-counter medicines, vitamins, herbs, and supplements. General instructions  You may be asked to shower with a germ-killing soap.  Plan to have a responsible adult take you home from the hospital or clinic.  Plan to have a responsible adult care for you for at least 24 hours after you leave the hospital or clinic. This is important. What happens during the procedure?  To lower your risk of infection: ? Your health care team will wash or sanitize their hands. ? Hair may be removed from the surgical area. ? Your skin will be washed with soap.  An IV will be inserted into one of your veins.  You will be given one or more of the following: ? A medicine to help you relax (sedative). ? A medicine to make you fall asleep (general anesthetic).  An incision will be made according to the type of parathyroidectomy procedure you are having. There are four methods that may be used: ? Open surgery. A single incision will be made in the center of your neck. The incision will be about 2-4 inches long. ? Minimally invasive surgery. A small incision will be made in the side of your neck. This incision will be about 1-2 inches long. Before the procedure, you might be given an injection of a type of medicine that will  help the surgeon to locate the gland. ? Video-assisted surgery. Two small incisions will be made in your neck. One incision is for the instruments that will be used to remove the gland. The other incision is for a tiny camera that will help the surgeon to see inside your neck. ? Endoscopic surgery. An incision will be made just above your collarbone. A  small, flexible tube (endoscope) will be inserted through this incision.  Your health care provider may monitor laryngeal nerve function during the procedure for safety reasons.  The gland or glands that are causing problems will be removed.  The incisions will be closed using stitches (sutures) or other methods. The sutures will often be hidden under the skin. The procedure may vary among health care providers and hospitals. What happens after the procedure?  Your blood pressure, heart rate, breathing rate, and blood oxygen level will be monitored until the medicines you were given have worn off.  You will be given pain medicine as needed.  Your provider will check your ability to talk and swallow after the procedure.  You will gradually start to drink liquids and have soft foods as tolerated.  Your blood will be tested to check the calcium level in your body.  Do not drive for 24 hours if you were given a sedative during your procedure. Summary  The parathyroid glands are located in the neck and produce parathyroid hormone, which helps to control the level of calcium in the body.  A parathyroidectomy is a surgery to remove one or more parathyroid glands.  You may have a parathyroidectomy if your body produces too much parathyroid hormone (hyperparathyroidism).  There are four surgical methods that may be used for a parathyroidectomy: open, minimally invasive, video-assisted, and endoscopic.  Generally, this is a safe procedure. However, problems may occur, including bleeding, infection, and a hoarse or weak voice. This information is not intended to replace advice given to you by your health care provider. Make sure you discuss any questions you have with your health care provider. Document Revised: 03/17/2020 Document Reviewed: 03/17/2020 Elsevier Patient Education  2021 [employer].

## 2020-08-08 ENCOUNTER — Telehealth: Payer: Self-pay | Admitting: General Surgery

## 2020-08-08 NOTE — Telephone Encounter (Signed)
Patient has been advised of Pre-Admission date/time, COVID Testing date and Surgery date.  Surgery Date: 08/29/20 Preadmission Testing Date: 08/17/20 (phone 8a-1p) Covid Testing Date: 08/25/20 - patient advised to go to the [facility] between 8a-1p   Patient has been made aware to call [phone], between 1-3:00pm the day before surgery, to find out what time to arrive for surgery.

## 2020-08-08 NOTE — Telephone Encounter (Signed)
Outgoing call is made, left message for patient to call so that we can discuss surgery dates.

## 2020-08-17 ENCOUNTER — Encounter
Admission: RE | Admit: 2020-08-17 | Discharge: 2020-08-17 | Disposition: A | Discharge status: Home / Self Care | Payer: Medicare HMO | Source: Ambulatory Visit | Attending: General Surgery | Admitting: General Surgery

## 2020-08-17 ENCOUNTER — Other Ambulatory Visit: Payer: Self-pay

## 2020-08-17 DIAGNOSIS — Z01818 Encounter for other preprocedural examination: Secondary | ICD-10-CM | POA: Insufficient documentation

## 2020-08-17 HISTORY — DX: Headache, unspecified: R51.9

## 2020-08-17 NOTE — Patient Instructions (Signed)
Your procedure is scheduled on:08-29-20 MONDAY Report to the Registration Desk on the 1st floor of the [facility]-Then proceed to the 2nd floor Surgery Desk in the [facility] To find out your arrival time, please call [phone] between 1PM - 3PM on:08-26-20 FRIDAY  REMEMBER: Instructions that are not followed completely may result in serious medical risk, up to and including death; or upon the discretion of your surgeon and anesthesiologist your surgery may need to be rescheduled.  Do not eat food after midnight the night before surgery.  No gum chewing, lozengers or hard candies.  You may however, drink CLEAR liquids up to 2 hours before you are scheduled to arrive for your surgery. Do not drink anything within 2 hours of your scheduled arrival time.  Clear liquids include: - water  - apple juice without pulp - gatorade - black coffee or tea (Do NOT add milk or creamers to the coffee or tea) Do NOT drink anything that is not on this list.  TAKE THESE MEDICATIONS THE MORNING OF SURGERY WITH A SIP OF WATER: -NONE  Follow recommendations from Cardiologist, Pulmonologist or PCP regarding stopping Aspirin, Coumadin, Plavix, Eliquis, Pradaxa, or Pletal-STOP YOUR 81 MG ASPIRIN 7 DAYS PRIOR TO SURGERY  One week prior to surgery: Stop Anti-inflammatories (NSAIDS) such as Advil, Aleve, Ibuprofen, Motrin, Naproxen, Naprosyn and Aspirin based products such as Excedrin, Goodys Powder, BC Powder-OK TO TAKE TYLENOL IF NEEDED  Stop ANY OVER THE COUNTER supplements until after surgery. (However, you may continue taking Vitamin D and Potassium up until the day before surgery.)  No Alcohol for 24 hours before or after surgery.  No Smoking including e-cigarettes for 24 hours prior to surgery.  No chewable tobacco products for at least 6 hours prior to surgery.  No nicotine patches on the day of surgery.  Do not use any "recreational" drugs for at least a week prior to your surgery.  Please  be advised that the combination of cocaine and anesthesia may have negative outcomes, up to and including death. If you test positive for cocaine, your surgery will be cancelled.  On the morning of surgery brush your teeth with toothpaste and water, you may rinse your mouth with mouthwash if you wish. Do not swallow any toothpaste or mouthwash.  Do not wear jewelry, make-up, hairpins, clips or nail polish.  Do not wear lotions, powders, or perfumes.   Do not shave body from the neck down 48 hours prior to surgery just in case you cut yourself which could leave a site for infection.  Also, freshly shaved skin may become irritated if using the CHG soap.  Contact lenses, hearing aids and dentures may not be worn into surgery.  Do not bring valuables to the hospital. [facility] is not responsible for any missing/lost belongings or valuables.   Use CHG Soap as directed on instruction sheet.   Notify your doctor if there is any change in your medical condition (cold, fever, infection).  Wear comfortable clothing (specific to your surgery type) to the hospital.  Plan for stool softeners for home use; pain medications have a tendency to cause constipation. You can also help prevent constipation by eating foods high in fiber such as fruits and vegetables and drinking plenty of fluids as your diet allows.  After surgery, you can help prevent lung complications by doing breathing exercises.  Take deep breaths and cough every 1-2 hours. Your doctor may order a device called an Incentive Spirometer to help you take  deep breaths. When coughing or sneezing, hold a pillow firmly against your incision with both hands. This is called "splinting." Doing this helps protect your incision. It also decreases belly discomfort.  If you are being admitted to the hospital overnight, leave your suitcase in the car. After surgery it may be brought to your room.  If you are being discharged the day of surgery,  you will not be allowed to drive home. You will need a responsible adult (18 years or older) to drive you home and stay with you that night.   If you are taking public transportation, you will need to have a responsible adult (18 years or older) with you. Please confirm with your physician that it is acceptable to use public transportation.   Please call the [facility] at [phone] if you have any questions about these instructions.  Visitation Policy:  Patients undergoing a surgery or procedure may have one family member or support person with them as long as that person is not COVID-19 positive or experiencing its symptoms.  That person may remain in the waiting area during the procedure.  Inpatient Visitation:    Visiting hours are 7 a.m. to 8 p.m. Patients will be allowed one visitor. The visitor may change daily. The visitor must pass COVID-19 screenings, use hand sanitizer when entering and exiting the patient's room and wear a mask at all times, including in the patient's room. Patients must also wear a mask when staff or their visitor are in the room. Masking is required regardless of vaccination status. Systemwide, no visitors 17 or younger.

## 2020-08-18 ENCOUNTER — Telehealth: Payer: Self-pay

## 2020-08-18 ENCOUNTER — Encounter
Admission: RE | Admit: 2020-08-18 | Discharge: 2020-08-18 | Disposition: A | Discharge status: Home / Self Care | Payer: Medicare HMO | Source: Ambulatory Visit | Attending: General Surgery | Admitting: General Surgery

## 2020-08-18 DIAGNOSIS — Z01818 Encounter for other preprocedural examination: Secondary | ICD-10-CM | POA: Insufficient documentation

## 2020-08-18 DIAGNOSIS — I10 Essential (primary) hypertension: Secondary | ICD-10-CM | POA: Insufficient documentation

## 2020-08-18 LAB — POTASSIUM: Potassium: 2.9 mmol/L — ABNORMAL LOW (ref 3.5–5.1)

## 2020-08-18 NOTE — Progress Notes (Signed)
  [facility] Perioperative Services: Pre-Admission/Anesthesia Testing  Abnormal Lab Notification   Date: 08/18/20  Name: Cristina Martinez MRN:   [id number]  Re: Abnormal labs noted during PAT appointment   Provider(s) Notified: [name], MD Notification mode: Routed and/or faxed via [facility]   ABNORMAL LAB VALUE(S): Lab Results  Component Value Date   K 2.9 (L) 08/18/2020   Notes:  Patient is scheduled for a parathyroidectomy on 08/29/2020.  Medication list reviewed.  Patient on daily diuretic therapy (HCTZ 25 mg).  Patient has a history of hypokalemia.  She takes Klor-Con 10 mEq on Mondays, Wednesdays, and Fridays.  Will forward results to attending surgeon for review an optimization.  Order placed for K+ to be rechecked on the day of surgery to ensure correction of noted derangement.   This is a Community education officer; no formal response is required.  Honor Loh, MSN, APRN, FNP-C, CEN [facility]  Peri-operative Services Nurse Practitioner Phone: [phone] Fax: [phone] 08/18/20 1:23 PM

## 2020-08-18 NOTE — Telephone Encounter (Signed)
Per Dr. Celine Ahr,  Ms. Califano's pre-op labs show that her potassium is too low. Per her chart, she is supposed to be taking 10 mEq of potassium chloride three days a week, but I suspect she may not be taking it; either that or she needs to take more. Can someone please contact her and find out if she is taking her potassium? If she is NOT taking it, please ask her to start, but to take it every single day. You may send in a new Rx, if needed. If she IS taking it, then we need to increase the dose to 20 mEq Monday/Wednesday/Friday.     I spoke with pt and she states that she is taking her potassium 10meq 3x a week. I advised pt to take 20 meq 3x a week. Pt states that she just picked up prescription and does not want a new one sent in at this time. She will double up on the 10 meq. She will also call when she gets low so we can send in 20 meq of potassium.

## 2020-08-25 ENCOUNTER — Other Ambulatory Visit: Payer: Self-pay

## 2020-08-25 ENCOUNTER — Other Ambulatory Visit
Admission: RE | Admit: 2020-08-25 | Discharge: 2020-08-25 | Disposition: A | Discharge status: Home / Self Care | Payer: Medicare HMO | Source: Ambulatory Visit | Attending: General Surgery | Admitting: General Surgery

## 2020-08-25 DIAGNOSIS — Z20822 Contact with and (suspected) exposure to covid-19: Secondary | ICD-10-CM | POA: Diagnosis not present

## 2020-08-25 DIAGNOSIS — Z01812 Encounter for preprocedural laboratory examination: Secondary | ICD-10-CM | POA: Insufficient documentation

## 2020-08-25 LAB — SARS CORONAVIRUS 2 (TAT 6-24 HRS): SARS Coronavirus 2: NEGATIVE

## 2020-08-29 ENCOUNTER — Ambulatory Visit
Admission: RE | Admit: 2020-08-29 | Discharge: 2020-08-29 | Disposition: A | Discharge status: Home / Self Care | Payer: Medicare HMO | Attending: General Surgery | Admitting: General Surgery

## 2020-08-29 ENCOUNTER — Other Ambulatory Visit: Payer: Self-pay

## 2020-08-29 ENCOUNTER — Encounter: Payer: Self-pay | Admitting: General Surgery

## 2020-08-29 ENCOUNTER — Ambulatory Visit: Payer: Medicare HMO | Admitting: Urgent Care

## 2020-08-29 ENCOUNTER — Encounter
Admission: RE | Disposition: A | Discharge status: Home / Self Care | Payer: Self-pay | Source: Home / Self Care | Attending: General Surgery

## 2020-08-29 DIAGNOSIS — Z87891 Personal history of nicotine dependence: Secondary | ICD-10-CM | POA: Insufficient documentation

## 2020-08-29 DIAGNOSIS — D351 Benign neoplasm of parathyroid gland: Secondary | ICD-10-CM

## 2020-08-29 DIAGNOSIS — Z7982 Long term (current) use of aspirin: Secondary | ICD-10-CM | POA: Insufficient documentation

## 2020-08-29 DIAGNOSIS — Z7983 Long term (current) use of bisphosphonates: Secondary | ICD-10-CM | POA: Diagnosis not present

## 2020-08-29 DIAGNOSIS — Z888 Allergy status to other drugs, medicaments and biological substances status: Secondary | ICD-10-CM | POA: Diagnosis not present

## 2020-08-29 DIAGNOSIS — E210 Primary hyperparathyroidism: Secondary | ICD-10-CM | POA: Insufficient documentation

## 2020-08-29 DIAGNOSIS — Z79899 Other long term (current) drug therapy: Secondary | ICD-10-CM | POA: Diagnosis not present

## 2020-08-29 DIAGNOSIS — E213 Hyperparathyroidism, unspecified: Secondary | ICD-10-CM

## 2020-08-29 HISTORY — PX: PARATHYROIDECTOMY: SHX19

## 2020-08-29 LAB — POCT I-STAT, CHEM 8
BUN: 16 mg/dL (ref 8–23)
Calcium, Ion: 1.42 mmol/L — ABNORMAL HIGH (ref 1.15–1.40)
Chloride: 101 mmol/L (ref 98–111)
Creatinine, Ser: 0.9 mg/dL (ref 0.44–1.00)
Glucose, Bld: 109 mg/dL — ABNORMAL HIGH (ref 70–99)
HCT: 39 % (ref 36.0–46.0)
Hemoglobin: 13.3 g/dL (ref 12.0–15.0)
Potassium: 2.9 mmol/L — ABNORMAL LOW (ref 3.5–5.1)
Sodium: 140 mmol/L (ref 135–145)
TCO2: 29 mmol/L (ref 22–32)

## 2020-08-29 LAB — PARATHYROID HORMONE, INTRAOP (ARMC ONLY)
Parathyroid Hormone: 85 pg/mL (ref 12–88)
Parathyroid Hormone: 235 pg/mL — ABNORMAL HIGH (ref 12–88)
Parathyroid Hormone: 411 pg/mL — ABNORMAL HIGH (ref 12–88)
Parathyroid Hormone: 935 pg/mL — ABNORMAL HIGH (ref 12–88)

## 2020-08-29 SURGERY — PARATHYROIDECTOMY
Anesthesia: General | Site: Neck

## 2020-08-29 MED ORDER — PROMETHAZINE HCL 25 MG/ML IJ SOLN: 6.2500 mg | INTRAMUSCULAR | Status: DC | PRN

## 2020-08-29 MED ORDER — DEXAMETHASONE SODIUM PHOSPHATE 10 MG/ML IJ SOLN
INTRAMUSCULAR | Status: DC | PRN
  Administered 2020-08-29: 10 mg via INTRAVENOUS

## 2020-08-29 MED ORDER — PROPOFOL 10 MG/ML IV BOLUS
INTRAVENOUS | Status: AC
  Filled 2020-08-29: qty 40

## 2020-08-29 MED ORDER — PROPOFOL 10 MG/ML IV BOLUS
INTRAVENOUS | Status: AC
  Filled 2020-08-29: qty 20

## 2020-08-29 MED ORDER — CHLORHEXIDINE GLUCONATE CLOTH 2 % EX PADS
6.0000 | MEDICATED_PAD | Freq: Once | CUTANEOUS | Status: AC
  Administered 2020-08-29: 6 via TOPICAL

## 2020-08-29 MED ORDER — ORAL CARE MOUTH RINSE: 15.0000 mL | Freq: Once | OROMUCOSAL | Status: AC

## 2020-08-29 MED ORDER — DEXAMETHASONE SODIUM PHOSPHATE 10 MG/ML IJ SOLN
INTRAMUSCULAR | Status: AC
  Filled 2020-08-29: qty 1

## 2020-08-29 MED ORDER — FENTANYL CITRATE (PF) 100 MCG/2ML IJ SOLN
INTRAMUSCULAR | Status: DC | PRN
  Administered 2020-08-29 (×2): 50 ug via INTRAVENOUS

## 2020-08-29 MED ORDER — PROPOFOL 500 MG/50ML IV EMUL
INTRAVENOUS | Status: DC | PRN
  Administered 2020-08-29: 125 ug/kg/min via INTRAVENOUS

## 2020-08-29 MED ORDER — HYDROMORPHONE HCL 1 MG/ML IJ SOLN
0.2500 mg | INTRAMUSCULAR | Status: DC | PRN
Start: 2020-08-29 — End: 2020-08-29

## 2020-08-29 MED ORDER — SUCCINYLCHOLINE CHLORIDE 20 MG/ML IJ SOLN
INTRAMUSCULAR | Status: DC | PRN
  Administered 2020-08-29: 120 mg via INTRAVENOUS

## 2020-08-29 MED ORDER — OSCAL 500/200 D-3 500-200 MG-UNIT PO TABS: 1.0000 | ORAL_TABLET | Freq: Three times a day (TID) | ORAL | 0 refills | Status: AC

## 2020-08-29 MED ORDER — DROPERIDOL 2.5 MG/ML IJ SOLN
0.6250 mg | Freq: Once | INTRAMUSCULAR | Status: DC | PRN
  Filled 2020-08-29: qty 2

## 2020-08-29 MED ORDER — APREPITANT 40 MG PO CAPS
ORAL_CAPSULE | ORAL | Status: AC
  Administered 2020-08-29: 40 mg via ORAL
  Filled 2020-08-29: qty 1

## 2020-08-29 MED ORDER — LACTATED RINGERS IV SOLN: INTRAVENOUS | Status: DC

## 2020-08-29 MED ORDER — MEPERIDINE HCL 50 MG/ML IJ SOLN
6.2500 mg | INTRAMUSCULAR | Status: DC | PRN
Start: 2020-08-29 — End: 2020-08-29

## 2020-08-29 MED ORDER — REMIFENTANIL HCL 1 MG IV SOLR
INTRAVENOUS | Status: AC
  Filled 2020-08-29: qty 1000

## 2020-08-29 MED ORDER — IBUPROFEN 800 MG PO TABS: 800.0000 mg | ORAL_TABLET | Freq: Three times a day (TID) | ORAL | 0 refills | Status: DC | PRN

## 2020-08-29 MED ORDER — PROPOFOL 10 MG/ML IV BOLUS
INTRAVENOUS | Status: DC | PRN
  Administered 2020-08-29: 130 mg via INTRAVENOUS

## 2020-08-29 MED ORDER — ONDANSETRON HCL 4 MG/2ML IJ SOLN
INTRAMUSCULAR | Status: AC
  Filled 2020-08-29: qty 2

## 2020-08-29 MED ORDER — PROPOFOL 500 MG/50ML IV EMUL
INTRAVENOUS | Status: AC
  Filled 2020-08-29: qty 50

## 2020-08-29 MED ORDER — ONDANSETRON HCL 4 MG/2ML IJ SOLN
INTRAMUSCULAR | Status: DC | PRN
  Administered 2020-08-29: 4 mg via INTRAVENOUS

## 2020-08-29 MED ORDER — FENTANYL CITRATE (PF) 100 MCG/2ML IJ SOLN
INTRAMUSCULAR | Status: AC
  Filled 2020-08-29: qty 2

## 2020-08-29 MED ORDER — SODIUM CHLORIDE 0.9 % IV SOLN
INTRAVENOUS | Status: DC | PRN
  Administered 2020-08-29: 40 ug/min via INTRAVENOUS

## 2020-08-29 MED ORDER — OXYCODONE HCL 5 MG PO TABS: 5.0000 mg | ORAL_TABLET | Freq: Once | ORAL | Status: DC | PRN

## 2020-08-29 MED ORDER — LORAZEPAM 2 MG/ML IJ SOLN: 1.0000 mg | Freq: Once | INTRAMUSCULAR | Status: DC | PRN

## 2020-08-29 MED ORDER — CHLORHEXIDINE GLUCONATE 0.12 % MT SOLN: 15.0000 mL | Freq: Once | OROMUCOSAL | Status: AC

## 2020-08-29 MED ORDER — ACETAMINOPHEN 500 MG PO TABS
ORAL_TABLET | ORAL | Status: AC
  Administered 2020-08-29: 1000 mg via ORAL
  Filled 2020-08-29: qty 2

## 2020-08-29 MED ORDER — CHLORHEXIDINE GLUCONATE 0.12 % MT SOLN
OROMUCOSAL | Status: AC
  Administered 2020-08-29: 15 mL via OROMUCOSAL
  Filled 2020-08-29: qty 15

## 2020-08-29 MED ORDER — HEMOSTATIC AGENTS (NO CHARGE) OPTIME
TOPICAL | Status: DC | PRN
  Administered 2020-08-29: 1 via TOPICAL

## 2020-08-29 MED ORDER — PHENYLEPHRINE HCL (PRESSORS) 10 MG/ML IV SOLN
INTRAVENOUS | Status: DC | PRN
  Administered 2020-08-29 (×4): 100 ug via INTRAVENOUS

## 2020-08-29 MED ORDER — MIDAZOLAM HCL 2 MG/2ML IJ SOLN
INTRAMUSCULAR | Status: AC
  Filled 2020-08-29: qty 2

## 2020-08-29 MED ORDER — PHENYLEPHRINE HCL (PRESSORS) 10 MG/ML IV SOLN
INTRAVENOUS | Status: AC
  Filled 2020-08-29: qty 1

## 2020-08-29 MED ORDER — OXYCODONE HCL 5 MG/5ML PO SOLN: 5.0000 mg | Freq: Once | ORAL | Status: DC | PRN

## 2020-08-29 MED ORDER — APREPITANT 40 MG PO CAPS
40.0000 mg | ORAL_CAPSULE | Freq: Once | ORAL | Status: AC
Start: 2020-08-29 — End: 2020-08-29

## 2020-08-29 MED ORDER — FAMOTIDINE 20 MG PO TABS: 20.0000 mg | ORAL_TABLET | Freq: Once | ORAL | Status: AC

## 2020-08-29 MED ORDER — REMIFENTANIL HCL 1 MG IV SOLR
INTRAVENOUS | Status: DC | PRN
  Administered 2020-08-29: .2 ug/kg/min via INTRAVENOUS

## 2020-08-29 MED ORDER — FAMOTIDINE 20 MG PO TABS
ORAL_TABLET | ORAL | Status: AC
  Administered 2020-08-29: 20 mg via ORAL
  Filled 2020-08-29: qty 1

## 2020-08-29 MED ORDER — TRAMADOL HCL 50 MG PO TABS: 50.0000 mg | ORAL_TABLET | Freq: Four times a day (QID) | ORAL | 0 refills | Status: AC | PRN

## 2020-08-29 MED ORDER — LACTATED RINGERS IV SOLN: INTRAVENOUS | Status: DC | PRN

## 2020-08-29 MED ORDER — ACETAMINOPHEN 500 MG PO TABS: 1000.0000 mg | ORAL_TABLET | ORAL | Status: AC

## 2020-08-29 MED ORDER — LIDOCAINE HCL (CARDIAC) PF 100 MG/5ML IV SOSY
PREFILLED_SYRINGE | INTRAVENOUS | Status: DC | PRN
  Administered 2020-08-29: 80 mg via INTRAVENOUS

## 2020-08-29 SURGICAL SUPPLY — 48 items
ADH SKN CLS APL DERMABOND .7 (GAUZE/BANDAGES/DRESSINGS) ×1
BACTOSHIELD CHG 4% 4OZ (MISCELLANEOUS) ×1
BASIN GRAD PLASTIC 32OZ STRL (MISCELLANEOUS) ×2 IMPLANT
BLADE SURG 15 STRL LF DISP TIS (BLADE) ×1 IMPLANT
BLADE SURG 15 STRL SS (BLADE) ×2
CANISTER SUCT 1200ML W/VALVE (MISCELLANEOUS) ×2 IMPLANT
CLIP VESOCCLUDE SM WIDE 6/CT (CLIP) ×2 IMPLANT
COVER WAND RF STERILE (DRAPES) ×2 IMPLANT
DERMABOND ADVANCED (GAUZE/BANDAGES/DRESSINGS) ×1
DERMABOND ADVANCED .7 DNX12 (GAUZE/BANDAGES/DRESSINGS) ×1 IMPLANT
DRAPE LAPAROTOMY 77X122 PED (DRAPES) ×2 IMPLANT
DRAPE MAG INST 16X20 L/F (DRAPES) ×2 IMPLANT
DRSG TEGADERM 2-3/8X2-3/4 SM (GAUZE/BANDAGES/DRESSINGS) IMPLANT
ELECT CAUTERY BLADE TIP 2.5 (TIP) ×2
ELECT LARYNGEAL DUAL CHAN (ELECTRODE) IMPLANT
ELECT NEEDLE 20X.3 GREEN (MISCELLANEOUS)
ELECT REM PT RETURN 9FT ADLT (ELECTROSURGICAL) ×2
ELECTRODE CAUTERY BLDE TIP 2.5 (TIP) ×1 IMPLANT
ELECTRODE NEEDLE 20X.3 GREEN (MISCELLANEOUS) IMPLANT
ELECTRODE REM PT RTRN 9FT ADLT (ELECTROSURGICAL) ×1 IMPLANT
GAUZE 4X4 16PLY RFD (DISPOSABLE) IMPLANT
GLOVE SURG ENC MOIS LTX SZ6.5 (GLOVE) ×2 IMPLANT
GLOVE SURG UNDER LTX SZ7 (GLOVE) ×2 IMPLANT
GOWN STRL REUS W/ TWL LRG LVL3 (GOWN DISPOSABLE) ×2 IMPLANT
GOWN STRL REUS W/TWL LRG LVL3 (GOWN DISPOSABLE) ×4
HEMOSTAT SNOW SURGICEL 2X4 (HEMOSTASIS) ×2 IMPLANT
KIT TURNOVER KIT A (KITS) ×2 IMPLANT
LABEL OR SOLS (LABEL) ×2 IMPLANT
MANIFOLD NEPTUNE II (INSTRUMENTS) ×2 IMPLANT
NDL SAFETY ECLIPSE 18X1.5 (NEEDLE) ×4 IMPLANT
NEEDLE HYPO 18GX1.5 SHARP (NEEDLE) ×8
NEEDLE HYPO 25X1 1.5 SAFETY (NEEDLE) ×8 IMPLANT
NS IRRIG 500ML POUR BTL (IV SOLUTION) ×2 IMPLANT
PACK BASIN MINOR ARMC (MISCELLANEOUS) ×2 IMPLANT
PROBE NEUROSIGN BIPOL (MISCELLANEOUS) IMPLANT
PROBE NEUROSIGN BIPOLAR (MISCELLANEOUS)
SCRUB CHG 4% DYNA-HEX 4OZ (MISCELLANEOUS) ×1 IMPLANT
SHEARS HARMONIC 9CM CVD (BLADE) IMPLANT
SPONGE KITTNER 5P (MISCELLANEOUS) ×2 IMPLANT
STRIP CLOSURE SKIN 1/2X4 (GAUZE/BANDAGES/DRESSINGS) ×2 IMPLANT
SUT MNCRL AB 4-0 PS2 18 (SUTURE) IMPLANT
SUT PROLENE 4 0 PS 2 18 (SUTURE) ×2 IMPLANT
SUT SILK 2 0 (SUTURE) ×2
SUT SILK 2-0 18XBRD TIE 12 (SUTURE) ×1 IMPLANT
SUT VIC AB 4-0 RB1 27 (SUTURE) ×2
SUT VIC AB 4-0 RB1 27X BRD (SUTURE) ×1 IMPLANT
SYR 3ML LL SCALE MARK (SYRINGE) ×8 IMPLANT
SYR BULB IRRIG 60ML STRL (SYRINGE) ×2 IMPLANT

## 2020-08-29 NOTE — Anesthesia Postprocedure Evaluation (Signed)
Anesthesia Post Note  Patient: Cristina Martinez  Procedure(s) Performed: PARATHYROIDECTOMY with RNFA to assist (N/A Neck)  Patient location during evaluation: PACU Anesthesia Type: General Level of consciousness: awake Pain management: pain level controlled Vital Signs Assessment: post-procedure vital signs reviewed and stable Respiratory status: spontaneous breathing Cardiovascular status: stable Postop Assessment: no apparent nausea or vomiting Anesthetic complications: no   No complications documented.   Last Vitals:  Vitals:   08/29/20 1013 08/29/20 1044  BP: (!) 148/86 122/65  Pulse: 65 64  Resp: 15 16  Temp:  (!) 36.1 C  SpO2: 94% 98%    Last Pain:  Vitals:   08/29/20 1044  TempSrc: Temporal  PainSc: 0-No pain                 Neva Seat

## 2020-08-29 NOTE — Anesthesia Procedure Notes (Addendum)
Procedure Name: Intubation Date/Time: 08/29/2020 7:40 AM Performed by: Hedda Slade, CRNA Pre-anesthesia Checklist: Patient identified, Patient being monitored, Timeout performed, Emergency Drugs available and Suction available Patient Re-evaluated:Patient Re-evaluated prior to induction Oxygen Delivery Method: Circle system utilized Preoxygenation: Pre-oxygenation with 100% oxygen Induction Type: IV induction Ventilation: Mask ventilation without difficulty Laryngoscope Size: 3 and McGraph Grade View: Grade I Tube type: Oral Tube size: 7.0 mm Number of attempts: 1 Airway Equipment and Method: Stylet Placement Confirmation: ETT inserted through vocal cords under direct vision,  positive ETCO2 and breath sounds checked- equal and bilateral Secured at: 20 cm Tube secured with: Tape Dental Injury: Teeth and Oropharynx as per pre-operative assessment  Comments: Performed by Heide Scales, SRNA under direct supervison by Dr. Addison Bailey and Hedda Slade, CRNA

## 2020-08-29 NOTE — Progress Notes (Signed)
Dr. Addison Bailey aware of pt K 2.9, no new orders given at this time

## 2020-08-29 NOTE — Op Note (Signed)
Operative Note  Preoperative Diagnosis:  primary hyperparathyroidism  Postoperative Diagnosis:  primary hyperparathyroidism  Operation:  focused/minimally invasive parathyroidectomy with intraoperative PTH monitoring  Surgeon: [name], MD  Assistant: Armida Sans, RNFA (a first assist was necessary for adequate exposure)  Anesthesia: General endotracheal  Findings: Enlarged superior parathyroid gland that had descended into the tracheoesophageal groove just behind the recurrent laryngeal nerve.  Intraoperative PTH values are as follows: Baseline: 85 Preexcision: 935 5-minute: 411 10-minute: 235 This represents a greater than 50% drop from the highest value and indicates adequate excision of a hyper secreting tissue.  Indications: This is a 80 year old woman with longstanding hypercalcemia.  Biochemical evaluation was consistent with primary hyperparathyroidism.  She does have osteoporosis and met criteria for parathyroidectomy.  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  The risks of parathyroidectomy were discussed with the patient and she agreed to proceed.  Procedure In Detail: The patient was identified in the preoperative holding area and brought to the operating room where she is placed supine on the OR table.  Bony prominences were padded and bilateral sequential compression devices were placed on the lower extremities.  General endotracheal anesthesia was induced without incident.  She was positioned appropriately for the operation and sterilely prepped and draped in standard fashion.  A timeout performed confirming the patient's identity, the procedure being performed, her allergies, all necessary equipment was available, and that maintenance anesthesia was adequate.  A 5 cm transverse incision was made midway between the sternal notch and thyroid cartilage.  This was carried down through the subcutaneous tissues and platysma using electrocautery.   Subplatysmal flaps were elevated and the strap muscles were divided in the median raphae.  We elevated the sternohyoid off of the sternothyroid on the right to expose the internal jugular vein.  Baseline PTH level was drawn and sent to the lab.  We then elevated the strap muscles off of the thyroid tissue.  As we began our exploration, initially we did not appreciate any parathyroid tissue.  The middle thyroid vein was divided and the thyroid rotated more medially.  At that point, we were able to appreciate an enlarged parathyroid gland with the recurrent laryngeal nerve running over the top of it.  The recurrent laryngeal nerve was delicately teased away from the parathyroid tissue.  The parathyroid gland was carefully dissected away from the surrounding structures and tissues.  It was elevated on its vascular pedicle which was then divided.  It was handed off as a specimen.  At the time of excision, we drew a PTH level and sent to the lab.  Five and 10 minutes post excision, additional PTH levels were drawn and sent to the lab.  We irrigated our wound bed and obtain good hemostasis.  A Valsalva maneuver from the anesthesia team confirmed no ongoing surgical bleeding.  We applied Snow to the wound bed as well as over our venipuncture site.  The strap muscles were closed in the midline with running 4-0 Vicryl and the platysma was closed with interrupted Vicryl.  The skin was closed with running subcuticular Prolene.  We awaited the results of our PTH levels and these returned as discussed under findings.  The skin was cleaned.  Dermabond and Steri-Strips were applied.  The Prolene suture was removed.  The patient was awakened, extubated, and taken to the postanesthesia care unit in good condition.  EBL: Less than 2 cc  IVF: See anesthesia record  Specimen(s): Right superior parathyroid gland to pathology for permanent section  Complications: none immediately apparent.   Counts: all needles, instruments,  and sponges were counted and reported to be correct in number at the end of the case.   I was present for and participated in the entire operation.  [name] 9:55 AM

## 2020-08-29 NOTE — Anesthesia Preprocedure Evaluation (Addendum)
Anesthesia Evaluation  Patient identified by MRN, date of birth, ID band Patient awake    Reviewed: Allergy & Precautions, H&P , NPO status , Patient's Chart, lab work & pertinent test results  History of Anesthesia Complications (+) PONV and history of anesthetic complications  Airway Mallampati: II       Dental  (+) Edentulous Upper, Poor Dentition Few lower teeth, most are missing:   Pulmonary neg pulmonary ROS, former smoker,    Pulmonary exam normal breath sounds clear to auscultation       Cardiovascular hypertension, negative cardio ROS Normal cardiovascular exam Rhythm:Regular Rate:Normal     Neuro/Psych  Headaches, negative psych ROS   GI/Hepatic negative GI ROS, Neg liver ROS,   Endo/Other  negative endocrine ROS  Renal/GU negative Renal ROS  negative genitourinary   Musculoskeletal  (+) Arthritis ,   Abdominal   Peds negative pediatric ROS (+)  Hematology negative hematology ROS (+)   Anesthesia Other Findings Past Medical History: No date: Arthritis No date: Headache     Comment:  H/O MIGRAINES No date: Hypercalcemia No date: Hyperlipidemia No date: Hypertension No date: PONV (postoperative nausea and vomiting) No date: Wears dentures     Comment:  full upper, partial lower   Reproductive/Obstetrics negative OB ROS                            Anesthesia Physical Anesthesia Plan  ASA: II  Anesthesia Plan: General   Post-op Pain Management:    Induction: Intravenous  PONV Risk Score and Plan: 4 or greater and Ondansetron, Dexamethasone and TIVA  Airway Management Planned: Oral ETT  Additional Equipment:   Intra-op Plan:   Post-operative Plan: Extubation in OR  Informed Consent: I have reviewed the patients History and Physical, chart, labs and discussed the procedure including the risks, benefits and alternatives for the proposed anesthesia with the patient  or authorized representative who has indicated his/her understanding and acceptance.     Dental advisory given  Plan Discussed with: CRNA, Anesthesiologist and Surgeon  Anesthesia Plan Comments:         Anesthesia Quick Evaluation

## 2020-08-29 NOTE — Interval H&P Note (Signed)
History and Physical Interval Note:  08/29/2020 7:15 AM  Cristina Martinez  has presented today for surgery, with the diagnosis of primary hyperparathyroidism.  The various methods of treatment have been discussed with the patient and family. After consideration of risks, benefits and other options for treatment, the patient has consented to  Procedure(s): PARATHYROIDECTOMY with RNFA to assist (N/A) as a surgical intervention.  The patient's history has been reviewed, patient examined, no change in status, stable for surgery.  I have reviewed the patient's chart and labs.  Questions were answered to the patient's satisfaction.     [name]

## 2020-08-29 NOTE — Discharge Instructions (Signed)
Please start taking 1 tablets of either Os-Cal 500 or Citrical 3 times daily. You may use Tums, instead, but please be sure to get the "ultra" variety.  Calcium can be quite constipating, so be sure to drink at least 64 oz of water or other non-caffeinated beverage daily. You can still drink caffeine in addition to this.  If necessary, you may use over the counter stool softeners or fiber supplements to help with constipation.  You may also have been prescribed a prescription-strength vitamin D supplement. This is in addition to any vitamin D contained in the calcium supplement and it is OK to take both.      Post-operative Home Care After Thyroid or Parathyroid Surgery  What should I expect after my operation?   Marland Kitchen You will see swelling under the incision and/or bruising under it in a few days. This is usually greatest on the second or third day after the operation. You may also feel the sensation of swelling and/or of firmness that can last for a month or more.    . Neck incisions heal rapidly, within a week or two. You can get them damp after about 24 hours, however do not submerge the incision or allow it to become soaked or saturated with water or sweat for 2 weeks.   . Your scar will be most visible 1-2 months after the operation and gradually fade over the next 6-8 months. As it heals, a scar looks more pink or red than the skin around it.   Dennis Bast may feel a firm 'healing ridge' directly under the incision. This is normal and will soften and go away when healing is complete within 3-6 months.   . All incisions are sensitive to sunlight.  For one year after surgery, you should use sunscreen when outdoors for long periods to prevent darkening of the scar area.    . We recommend that you not expose the incision to the ultraviolet lights used in tanning booths.    Will my neck hurt?  . Most patients experience very little pain, but you may feel some neck stiffness/soreness in your shoulder,  back or neck and tension headache that takes a few days or weeks to go away completely.    . Some patients also notice minor changes in swallowing, which improve over time.   . The skin just above and below your incision will feel numb. This will improve over several months, but some persons may have a longterm decrease in sensation.   .  You may apply cold pack over your incision to improve the pain.    How will I manage my pain at home?  . Take NSAIDS like ibuprofen (Motrin, Advil), naproxen (Naprosyn, Aleve) or acetaminophen (Tylenol) every 6 hours for the first 3-5 days after operation. This will minimize the pain you feel.      ? To prevent Tylenol overdose, do not take a Tylenol doses at the same time as a combination narcotic dose that contains Tylenol, like Norco and Percocet. However, You may take them 4-6 hours apart.     . If you have sore/stiff muscles in your back, shoulder or neck, you may use moist warm heat or heating pad on the affected areas 15-20 minutes at a time several times a day.   . Gently massaging your neck muscles will improve the neck stiffness.    . Do not be afraid to move your neck. Gently flexing and stretching your neck muscles will prevent  stiffness.    . Stronger pain medication or narcotic (like Norco, Percocet) for severe pain is rarely needed. If it is, however, DO NOT drive a car or drink alcohol while taking these medications.    . Narcotics also cause constipation. Stool softeners (Colace) and fiber (fruits, bran, vegetables) and extra fluid intake helps. A stimulant laxative (Milk of Magnesia, Senokot) may be needed as well.    Will my voice be affected?  Your voice may be hoarse or weak at first, because the surgery took place near the voice box, but usually recovers within weeks. Some patients also notice a change in the pitch of their voices that affects singing. Rarely, these changes can be permanent.   Are there any diet restrictions?  No,  return to your previous diet and always eat a well balanced diet, low in fat, etc.    How will I care for my incision?  . If you have paper "steri strips" on your incision, leave them in place until they begin to fall off naturally. If they become discolored or messy, you may remove them 7-10 days after your operation.   . If you have a skin glue (Dermabond) closure, you may notice tiny pieces of yellow material on your washcloth. Any sutures (stitches) you may have are dissolvable and do not need to be removed.  . You may shower then gently pat dry your incision.   . Do not apply ointments or powders.   . Avoid using Vitamin E cream or other moisturizers on the incision until after your first follow-up visit.   What new medications might I take home?   ? Calcium supplement:  Your body's calcium levels may fall after a total thyroidectomy or parathyroid operation. We recommend you purchase Os-Cal 500 (one tablet equals 500 mg of elemental calcium) or Citracal (2 tablets equal 630 mg of elemental calcium; the "Petites" version contains 500 mg elemental calcium per 2 tablets). You may be taking 3-6 (or more) tablets per day, depending on your doctor's recommendation. You will need to take calcium at different times to avoid medication interaction. Ask your pharmacists, nurse, or doctor about specific interactions.   ? Thyroid Hormone:  If you have had a thyroid operation, you may be prescribed thyroid hormone replacement, called levothyroxine (Synthroid, Levothroid, etc.). A blood test will be done in 6-8 weeks to ensure the dosage is correct  by your doctor or your surgeon.   ? Vitamin D:  If your doctor has prescribed a Vitamin D supplement, like Calcitriol (Rocaltrol), try to get it filled at the hospital pharmacy before you leave.  Sometimes regular pharmacies do not stock it and will need to order it in.  When can I go back to normal activities?  Marland Kitchen You may return to work in 5-7 days or sooner  if desired. Contact the clinic coordinator if you need employer forms completed.   . You may drive as long as you are not taking any narcotics and your neck stiffness is resolved    Can I resume my previous medications?   . Yes, unless directed not to by your doctor.   . Before discharge, be sure to review your previous medications with your doctor or inpatient medical team.    When do I call for advice?   - If your temperature > 101.5  - You have trouble talking or breathing  - Your fingers/ hands or face or around your lips becomes numb and tingling. (This may mean  your calcium level is low.)  - You have trouble swallowing  - Your incision becomes swollen, red or drainage occurs.    We recommend that you keep a package of Tums with you.  If you experience numbness or tingling, please chew 2-3 Tums.  If this does not alleviate the numbness and tingling after 30 minutes, chew an additional 2-3 Tums.  If it persists, please call our office for further instructions.  AMBULATORY SURGERY  DISCHARGE INSTRUCTIONS   1) The drugs that you were given will stay in your system until tomorrow so for the next 24 hours you should not:  A) Drive an automobile B) Make any legal decisions C) Drink any alcoholic beverage   2) You may resume regular meals tomorrow.  Today it is better to start with liquids and gradually work up to solid foods.  You may eat anything you prefer, but it is better to start with liquids, then soup and crackers, and gradually work up to solid foods.   3) Please notify your doctor immediately if you have any unusual bleeding, trouble breathing, redness and pain at the surgery site, drainage, fever, or pain not relieved by medication.    4) Additional Instructions:    Please contact your physician with any problems or Same Day Surgery at [phone], Monday through Friday 6 am to 4 pm, or [facility] number at [phone].

## 2020-08-29 NOTE — Transfer of Care (Signed)
Immediate Anesthesia Transfer of Care Note  Patient: Cristina Martinez  Procedure(s) Performed: PARATHYROIDECTOMY with RNFA to assist (N/A Neck)  Patient Location: PACU  Anesthesia Type:General  Level of Consciousness: awake and alert   Airway & Oxygen Therapy: Patient Spontanous Breathing and Patient connected to face mask oxygen  Post-op Assessment: Report given to RN, Post -op Vital signs reviewed and stable and Patient moving all extremities X 4  Post vital signs: Reviewed and stable  Last Vitals:  Vitals Value Taken Time  BP 136/80 08/29/20 0943  Temp    Pulse 71 08/29/20 0944  Resp 17 08/29/20 0944  SpO2 100 % 08/29/20 0944  Vitals shown include unvalidated device data.  Last Pain:  Vitals:   08/29/20 0625  TempSrc: Oral  PainSc: 0-No pain         Complications: No complications documented.

## 2020-08-30 ENCOUNTER — Encounter: Payer: Self-pay | Admitting: General Surgery

## 2020-08-30 LAB — SURGICAL PATHOLOGY

## 2020-09-15 ENCOUNTER — Ambulatory Visit (INDEPENDENT_AMBULATORY_CARE_PROVIDER_SITE_OTHER): Payer: Medicare HMO | Admitting: General Surgery

## 2020-09-15 ENCOUNTER — Other Ambulatory Visit: Payer: Self-pay

## 2020-09-15 ENCOUNTER — Telehealth: Payer: Self-pay | Admitting: General Surgery

## 2020-09-15 ENCOUNTER — Encounter: Payer: Self-pay | Admitting: General Surgery

## 2020-09-15 ENCOUNTER — Other Ambulatory Visit
Admission: RE | Admit: 2020-09-15 | Discharge: 2020-09-15 | Disposition: A | Discharge status: Home / Self Care | Payer: Medicare HMO | Attending: General Surgery | Admitting: General Surgery

## 2020-09-15 VITALS — BP 138/83 | HR 75 | Temp 98.4°F | Ht 65.0 in

## 2020-09-15 DIAGNOSIS — E892 Postprocedural hypoparathyroidism: Secondary | ICD-10-CM | POA: Insufficient documentation

## 2020-09-15 DIAGNOSIS — E213 Hyperparathyroidism, unspecified: Secondary | ICD-10-CM

## 2020-09-15 LAB — BASIC METABOLIC PANEL
Anion gap: 12 (ref 5–15)
BUN: 16 mg/dL (ref 8–23)
CO2: 27 mmol/L (ref 22–32)
Calcium: 9.9 mg/dL (ref 8.9–10.3)
Chloride: 101 mmol/L (ref 98–111)
Creatinine, Ser: 0.93 mg/dL (ref 0.44–1.00)
GFR, Estimated: >60 mL/min (ref >60)
Glucose, Bld: 110 mg/dL — ABNORMAL HIGH (ref 70–99)
Potassium: 3.6 mmol/L (ref 3.5–5.1)
Sodium: 140 mmol/L (ref 135–145)

## 2020-09-15 LAB — ALBUMIN: Albumin: 4.2 g/dL (ref 3.5–5.0)

## 2020-09-15 LAB — MAGNESIUM: Magnesium: 1.7 mg/dL (ref 1.7–2.4)

## 2020-09-15 LAB — PHOSPHORUS: Phosphorus: 4.1 mg/dL (ref 2.5–4.6)

## 2020-09-15 NOTE — Patient Instructions (Addendum)
Massage shea butter or cocoa butter on the incision to help soften, flatten the scar. Be sure to use sun screen on the area when out side.  Please go directly to the lab today. Continue to take the Calcium and potassium. Dr.Cannon will call you later today with the results.

## 2020-09-15 NOTE — Telephone Encounter (Signed)
I discussed the lab results with Cristina Martinez.  Calcium and potassium are both normal.  I think she should continue taking the extra potassium going forward, as previously, her potassium levels were low.  I think she can decrease her calcium to 2 tablets once daily, rather than 1 tablet 3 times daily.  She should remain on this for bone health.  She expressed understanding and stated that she had no questions.

## 2020-09-15 NOTE — Progress Notes (Signed)
Cristina Martinez is here today for a postoperative visit.  She is a 80 year old woman who underwent minimally invasive parathyroidectomy on August 29, 2020.  An enlarged right superior parathyroid gland that had descended into the TE groove was excised.  Intraoperative PTH levels dropped from a high of 935 to a 10-minute level of 235.  She has been taking Os-Cal 501 tablet 3 times daily.  She reports that she has been having some leg and left hand cramping.  She reports that these were present prior to surgery, but perhaps not quite as frequent as she has experienced them since her operation.  She also is concerned about a "knot" at her incision site.  Her voice and swallowing are normal.  Appetite is good.  Today's Vitals   09/15/20 0855  BP: 138/83  Pulse: 75  Temp: 98.4 F (36.9 C)  TempSrc: Oral  Height: 5\' 5"  (1.651 m)   Body mass index is 24.96 kg/m. Focused cervical examination demonstrates a well approximated transverse parathyroidectomy scar.  There is a distinct healing ridge present.  There is no erythema or drainage.  Impression and plan: This is a 80 year old woman who had biochemical evidence of primary hyperparathyroidism as well as osteoporosis.  She underwent a single gland parathyroidectomy on February 14 and had an appropriate drop in her intraoperative PTH levels.  She is describing some symptoms that could potentially reflect hypocalcemia.  She has also been taking an increased dose of potassium, due to preoperative low potassium levels.  We will send her to the lab today to check her electrolytes.  I also discussed scar care with her today, including massage with a topical emollient agent to help resolve the healing ridge, improve cosmesis, and limit tethering.  She was also instructed to use sunscreen for the next year whenever UV light exposure is anticipated.  Once I have the results of her labs, I will contact her and let her know what she should do with her potassium and  calcium supplementation.  I also recommended that she contact Dr. Joycie Peek office for follow-up of her osteoporosis.  I will see her here on an as-needed basis.

## 2020-09-27 ENCOUNTER — Other Ambulatory Visit: Payer: Self-pay | Admitting: Internal Medicine

## 2020-09-27 DIAGNOSIS — Z1231 Encounter for screening mammogram for malignant neoplasm of breast: Secondary | ICD-10-CM

## 2020-10-17 ENCOUNTER — Ambulatory Visit
Admission: RE | Admit: 2020-10-17 | Discharge: 2020-10-17 | Disposition: A | Discharge status: Home / Self Care | Payer: Medicare HMO | Source: Ambulatory Visit | Attending: Internal Medicine | Admitting: Internal Medicine

## 2020-10-17 ENCOUNTER — Other Ambulatory Visit: Payer: Self-pay

## 2020-10-17 DIAGNOSIS — Z1231 Encounter for screening mammogram for malignant neoplasm of breast: Secondary | ICD-10-CM | POA: Diagnosis not present

## 2020-12-29 DIAGNOSIS — I10 Essential (primary) hypertension: Secondary | ICD-10-CM | POA: Diagnosis not present

## 2020-12-29 DIAGNOSIS — R7302 Impaired glucose tolerance (oral): Secondary | ICD-10-CM | POA: Diagnosis not present

## 2020-12-29 DIAGNOSIS — E782 Mixed hyperlipidemia: Secondary | ICD-10-CM | POA: Diagnosis not present

## 2021-01-10 DIAGNOSIS — M810 Age-related osteoporosis without current pathological fracture: Secondary | ICD-10-CM | POA: Diagnosis not present

## 2021-01-10 DIAGNOSIS — R29890 Loss of height: Secondary | ICD-10-CM | POA: Diagnosis not present

## 2021-03-31 DIAGNOSIS — I10 Essential (primary) hypertension: Secondary | ICD-10-CM | POA: Diagnosis not present

## 2021-03-31 DIAGNOSIS — Z23 Encounter for immunization: Secondary | ICD-10-CM | POA: Diagnosis not present

## 2021-03-31 DIAGNOSIS — R7302 Impaired glucose tolerance (oral): Secondary | ICD-10-CM | POA: Diagnosis not present

## 2021-03-31 DIAGNOSIS — E782 Mixed hyperlipidemia: Secondary | ICD-10-CM | POA: Diagnosis not present

## 2021-03-31 DIAGNOSIS — M549 Dorsalgia, unspecified: Secondary | ICD-10-CM | POA: Diagnosis not present

## 2021-04-03 DIAGNOSIS — M549 Dorsalgia, unspecified: Secondary | ICD-10-CM | POA: Diagnosis not present

## 2021-05-01 DIAGNOSIS — R7302 Impaired glucose tolerance (oral): Secondary | ICD-10-CM | POA: Diagnosis not present

## 2021-05-01 DIAGNOSIS — E782 Mixed hyperlipidemia: Secondary | ICD-10-CM | POA: Diagnosis not present

## 2021-05-01 DIAGNOSIS — I10 Essential (primary) hypertension: Secondary | ICD-10-CM | POA: Diagnosis not present

## 2021-05-01 DIAGNOSIS — R002 Palpitations: Secondary | ICD-10-CM | POA: Diagnosis not present

## 2021-05-05 ENCOUNTER — Encounter: Payer: Self-pay | Admitting: General Surgery

## 2021-05-30 DIAGNOSIS — R200 Anesthesia of skin: Secondary | ICD-10-CM | POA: Diagnosis not present

## 2021-05-30 DIAGNOSIS — R202 Paresthesia of skin: Secondary | ICD-10-CM | POA: Diagnosis not present

## 2021-08-08 DIAGNOSIS — E538 Deficiency of other specified B group vitamins: Secondary | ICD-10-CM | POA: Diagnosis not present

## 2021-08-08 DIAGNOSIS — H6501 Acute serous otitis media, right ear: Secondary | ICD-10-CM | POA: Diagnosis not present

## 2021-08-08 DIAGNOSIS — R7302 Impaired glucose tolerance (oral): Secondary | ICD-10-CM | POA: Diagnosis not present

## 2021-08-08 DIAGNOSIS — M7501 Adhesive capsulitis of right shoulder: Secondary | ICD-10-CM | POA: Diagnosis not present

## 2021-08-08 DIAGNOSIS — M1990 Unspecified osteoarthritis, unspecified site: Secondary | ICD-10-CM | POA: Diagnosis not present

## 2021-08-08 DIAGNOSIS — I10 Essential (primary) hypertension: Secondary | ICD-10-CM | POA: Diagnosis not present

## 2021-08-08 DIAGNOSIS — E782 Mixed hyperlipidemia: Secondary | ICD-10-CM | POA: Diagnosis not present

## 2021-08-08 DIAGNOSIS — H6123 Impacted cerumen, bilateral: Secondary | ICD-10-CM | POA: Diagnosis not present

## 2021-08-17 ENCOUNTER — Other Ambulatory Visit: Payer: Self-pay | Admitting: Internal Medicine

## 2021-08-17 DIAGNOSIS — M5136 Other intervertebral disc degeneration, lumbar region: Secondary | ICD-10-CM

## 2021-08-18 DIAGNOSIS — E782 Mixed hyperlipidemia: Secondary | ICD-10-CM | POA: Diagnosis not present

## 2021-08-18 DIAGNOSIS — H6123 Impacted cerumen, bilateral: Secondary | ICD-10-CM | POA: Diagnosis not present

## 2021-08-18 DIAGNOSIS — R7302 Impaired glucose tolerance (oral): Secondary | ICD-10-CM | POA: Diagnosis not present

## 2021-08-18 DIAGNOSIS — I10 Essential (primary) hypertension: Secondary | ICD-10-CM | POA: Diagnosis not present

## 2021-08-30 DIAGNOSIS — R200 Anesthesia of skin: Secondary | ICD-10-CM | POA: Diagnosis not present

## 2021-08-30 DIAGNOSIS — G629 Polyneuropathy, unspecified: Secondary | ICD-10-CM | POA: Diagnosis not present

## 2021-08-30 DIAGNOSIS — M542 Cervicalgia: Secondary | ICD-10-CM | POA: Diagnosis not present

## 2021-08-30 DIAGNOSIS — R202 Paresthesia of skin: Secondary | ICD-10-CM | POA: Diagnosis not present

## 2021-08-31 ENCOUNTER — Ambulatory Visit
Admission: RE | Admit: 2021-08-31 | Discharge: 2021-08-31 | Disposition: A | Discharge status: Home / Self Care | Payer: Medicare HMO | Source: Ambulatory Visit | Attending: Internal Medicine | Admitting: Internal Medicine

## 2021-08-31 DIAGNOSIS — M5136 Other intervertebral disc degeneration, lumbar region: Secondary | ICD-10-CM | POA: Insufficient documentation

## 2021-08-31 DIAGNOSIS — M4316 Spondylolisthesis, lumbar region: Secondary | ICD-10-CM | POA: Diagnosis not present

## 2021-08-31 DIAGNOSIS — M48061 Spinal stenosis, lumbar region without neurogenic claudication: Secondary | ICD-10-CM | POA: Diagnosis not present

## 2021-09-27 DIAGNOSIS — G8929 Other chronic pain: Secondary | ICD-10-CM | POA: Diagnosis not present

## 2021-09-27 DIAGNOSIS — M47816 Spondylosis without myelopathy or radiculopathy, lumbar region: Secondary | ICD-10-CM | POA: Diagnosis not present

## 2021-09-27 DIAGNOSIS — M5417 Radiculopathy, lumbosacral region: Secondary | ICD-10-CM | POA: Diagnosis not present

## 2021-09-27 DIAGNOSIS — M5137 Other intervertebral disc degeneration, lumbosacral region: Secondary | ICD-10-CM | POA: Diagnosis not present

## 2021-09-27 DIAGNOSIS — M5442 Lumbago with sciatica, left side: Secondary | ICD-10-CM | POA: Diagnosis not present

## 2021-09-27 DIAGNOSIS — M48061 Spinal stenosis, lumbar region without neurogenic claudication: Secondary | ICD-10-CM | POA: Diagnosis not present

## 2021-09-27 DIAGNOSIS — M5136 Other intervertebral disc degeneration, lumbar region: Secondary | ICD-10-CM | POA: Diagnosis not present

## 2021-10-26 DIAGNOSIS — R202 Paresthesia of skin: Secondary | ICD-10-CM | POA: Diagnosis not present

## 2021-10-26 DIAGNOSIS — R200 Anesthesia of skin: Secondary | ICD-10-CM | POA: Diagnosis not present

## 2021-10-26 DIAGNOSIS — R531 Weakness: Secondary | ICD-10-CM | POA: Diagnosis not present

## 2021-10-26 DIAGNOSIS — G629 Polyneuropathy, unspecified: Secondary | ICD-10-CM | POA: Diagnosis not present

## 2021-11-20 DIAGNOSIS — M48061 Spinal stenosis, lumbar region without neurogenic claudication: Secondary | ICD-10-CM | POA: Diagnosis not present

## 2021-11-20 DIAGNOSIS — E782 Mixed hyperlipidemia: Secondary | ICD-10-CM | POA: Diagnosis not present

## 2021-11-20 DIAGNOSIS — R7302 Impaired glucose tolerance (oral): Secondary | ICD-10-CM | POA: Diagnosis not present

## 2021-11-20 DIAGNOSIS — I10 Essential (primary) hypertension: Secondary | ICD-10-CM | POA: Diagnosis not present

## 2021-11-24 ENCOUNTER — Other Ambulatory Visit: Payer: Self-pay | Admitting: Internal Medicine

## 2021-11-24 DIAGNOSIS — Z1231 Encounter for screening mammogram for malignant neoplasm of breast: Secondary | ICD-10-CM

## 2022-01-08 ENCOUNTER — Ambulatory Visit
Admission: RE | Admit: 2022-01-08 | Discharge: 2022-01-08 | Disposition: A | Discharge status: Home / Self Care | Payer: Medicare HMO | Source: Ambulatory Visit | Attending: Internal Medicine | Admitting: Internal Medicine

## 2022-01-08 DIAGNOSIS — Z1231 Encounter for screening mammogram for malignant neoplasm of breast: Secondary | ICD-10-CM | POA: Insufficient documentation

## 2022-02-20 DIAGNOSIS — R197 Diarrhea, unspecified: Secondary | ICD-10-CM | POA: Diagnosis not present

## 2022-02-20 DIAGNOSIS — M7501 Adhesive capsulitis of right shoulder: Secondary | ICD-10-CM | POA: Diagnosis not present

## 2022-02-20 DIAGNOSIS — E559 Vitamin D deficiency, unspecified: Secondary | ICD-10-CM | POA: Diagnosis not present

## 2022-02-20 DIAGNOSIS — I10 Essential (primary) hypertension: Secondary | ICD-10-CM | POA: Diagnosis not present

## 2022-02-20 DIAGNOSIS — E782 Mixed hyperlipidemia: Secondary | ICD-10-CM | POA: Diagnosis not present

## 2022-06-22 DIAGNOSIS — I10 Essential (primary) hypertension: Secondary | ICD-10-CM | POA: Diagnosis not present

## 2022-06-22 DIAGNOSIS — Z0000 Encounter for general adult medical examination without abnormal findings: Secondary | ICD-10-CM | POA: Diagnosis not present

## 2022-06-22 DIAGNOSIS — I340 Nonrheumatic mitral (valve) insufficiency: Secondary | ICD-10-CM | POA: Diagnosis not present

## 2022-06-22 DIAGNOSIS — R7302 Impaired glucose tolerance (oral): Secondary | ICD-10-CM | POA: Diagnosis not present

## 2022-06-22 DIAGNOSIS — E782 Mixed hyperlipidemia: Secondary | ICD-10-CM | POA: Diagnosis not present

## 2022-07-01 IMAGING — MG MM DIGITAL SCREENING BILAT W/ TOMO AND CAD
6 of 10 series · 6 of 30 positions shown · non-contrast
Comparison: Previous exam(s).

CLINICAL DATA: Screening.

EXAM:
DIGITAL SCREENING BILATERAL MAMMOGRAM WITH TOMOSYNTHESIS AND CAD
TECHNIQUE: Bilateral screening digital craniocaudal and mediolateral oblique
mammograms were obtained. Bilateral screening digital breast
tomosynthesis was performed. The images were evaluated with
computer-aided detection.

[L CC synth-2D]
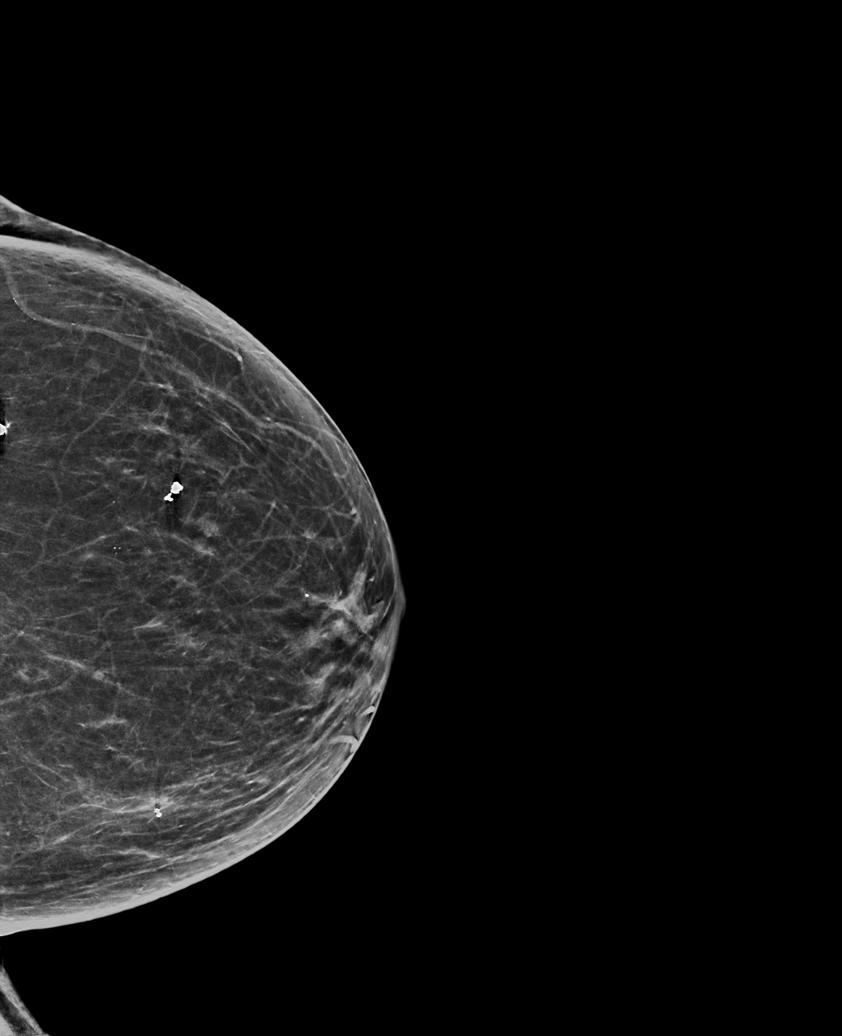

[L MLO synth-2D (1 of 2)]
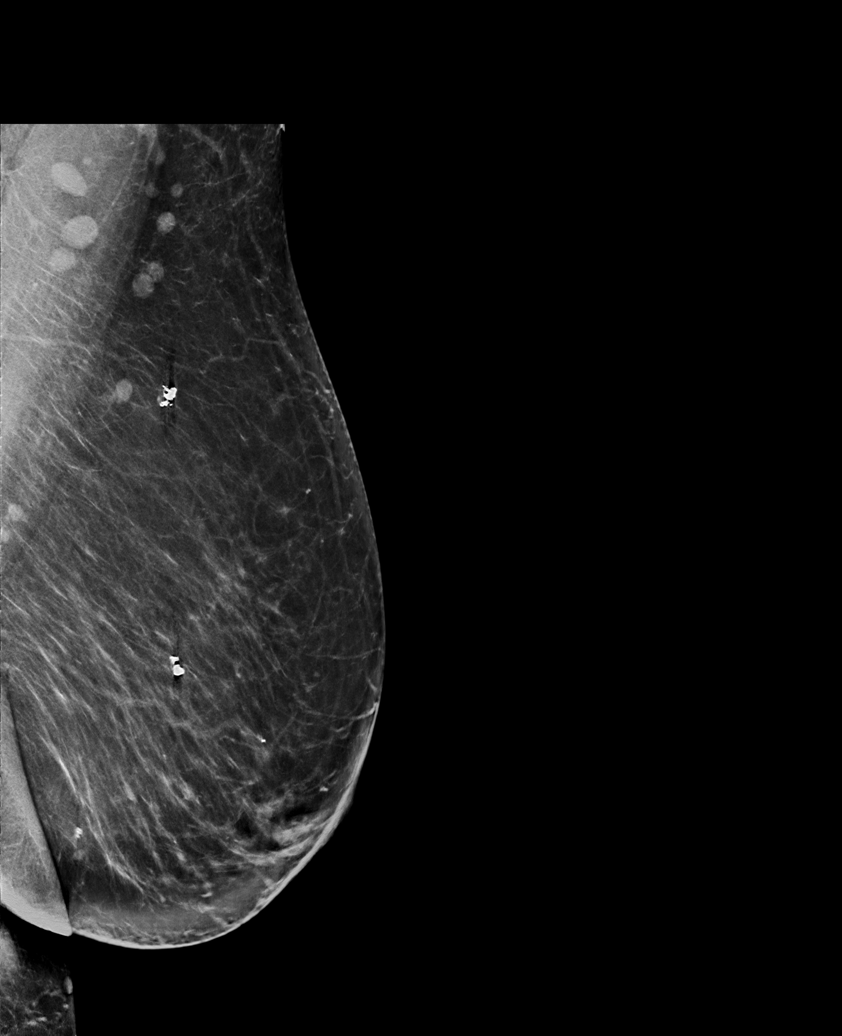

[L MLO synth-2D (2 of 2)]
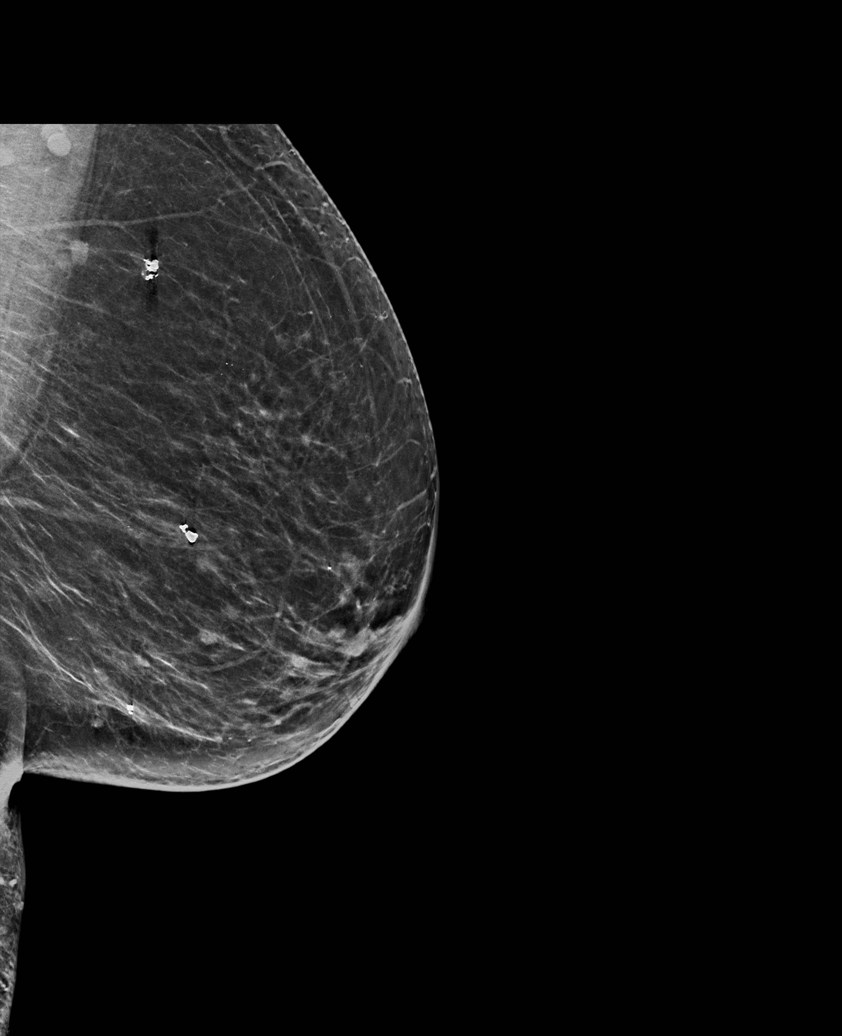

[R CC synth-2D]
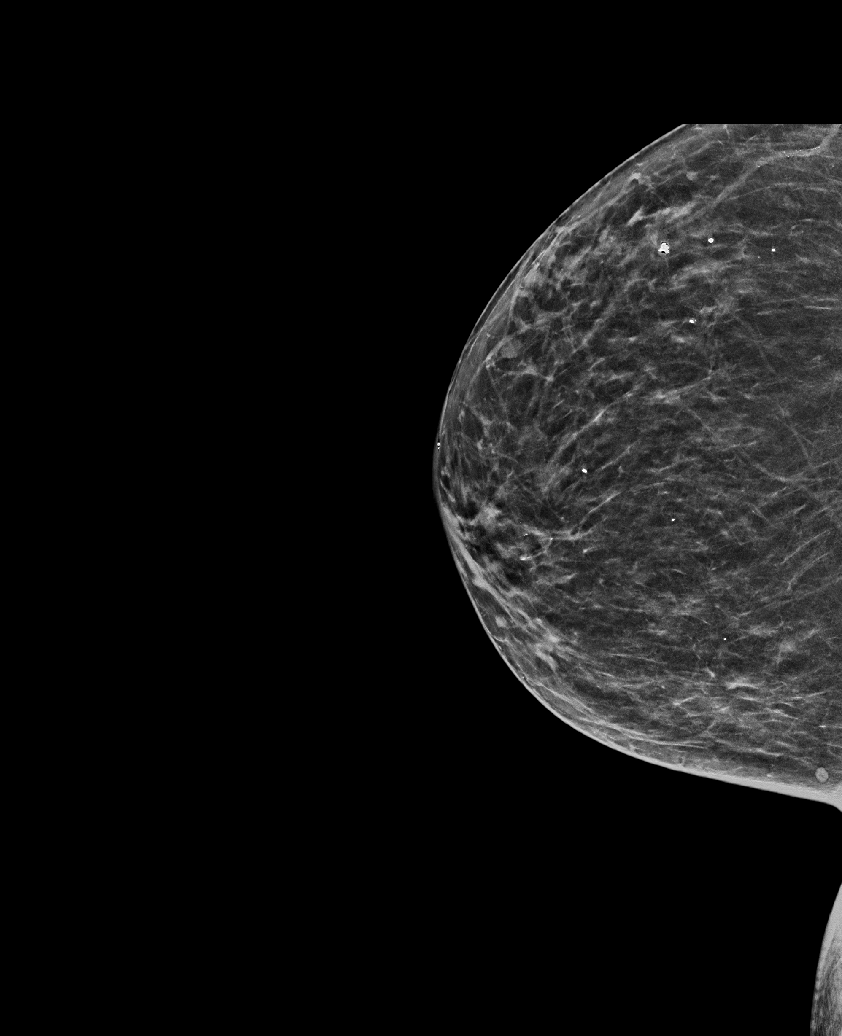

[R MLO synth-2D]
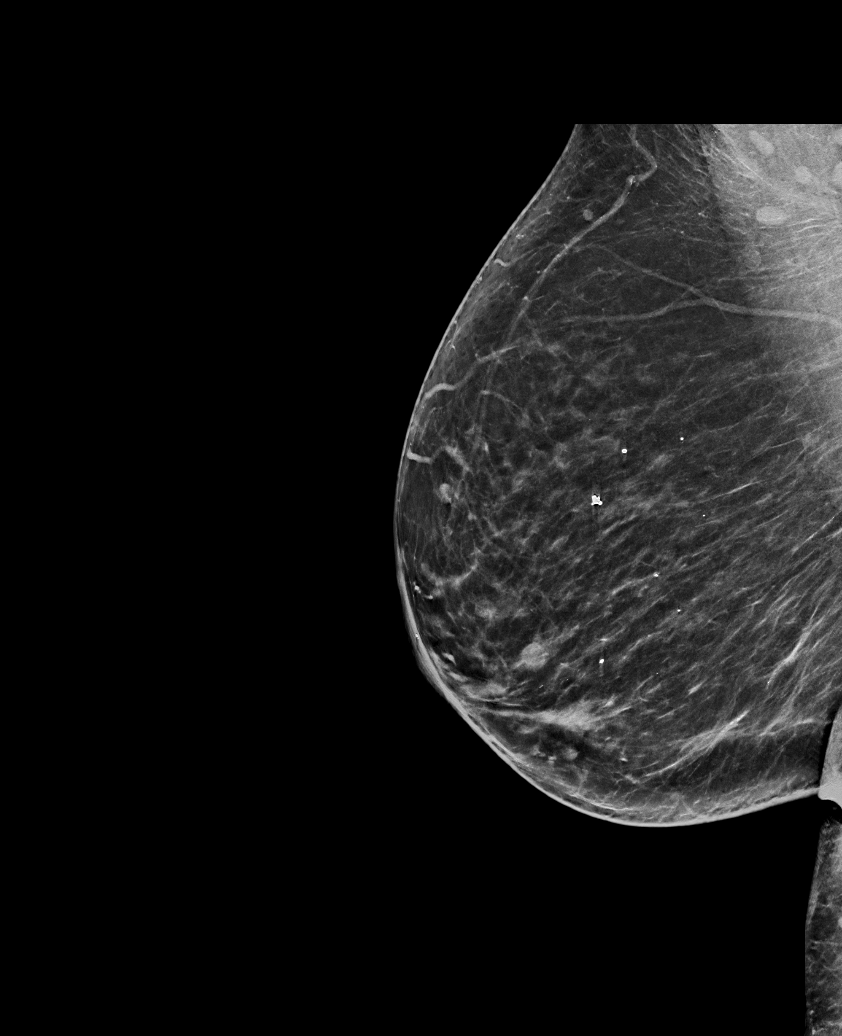

[R MLO tomo · tomo slice 35/70.0]
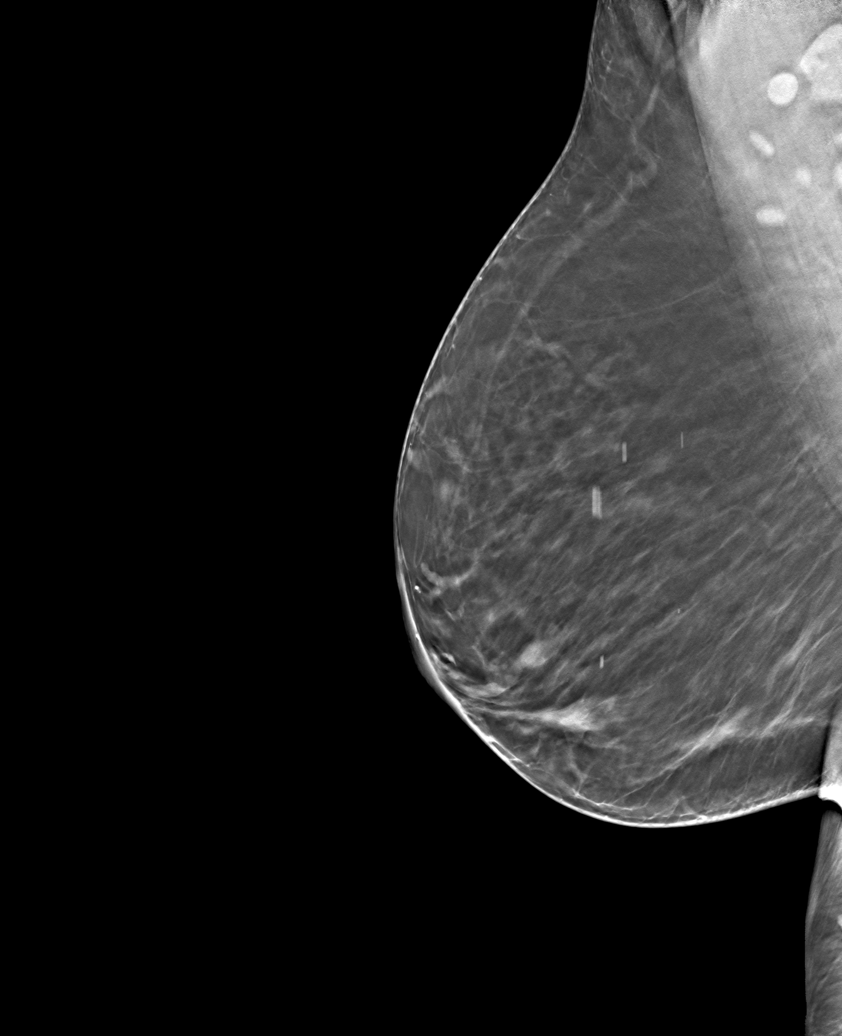

[6 of 30 positions shown; findings below may reference images not displayed]

ACR Breast Density Category b: There are scattered areas of
fibroglandular density.
FINDINGS: There are no findings suspicious for malignancy. The images were
evaluated with computer-aided detection.
IMPRESSION: No mammographic evidence of malignancy. A result letter of this
screening mammogram will be mailed directly to the patient.

RECOMMENDATION:
Screening mammogram in one year. (Code:WJ-I-BG6)

BI-RADS CATEGORY  1: Negative.

## 2022-07-13 DIAGNOSIS — I340 Nonrheumatic mitral (valve) insufficiency: Secondary | ICD-10-CM | POA: Diagnosis not present

## 2022-10-29 ENCOUNTER — Encounter: Payer: Self-pay | Admitting: Internal Medicine

## 2022-10-29 ENCOUNTER — Ambulatory Visit (INDEPENDENT_AMBULATORY_CARE_PROVIDER_SITE_OTHER): Payer: Medicare HMO | Admitting: Internal Medicine

## 2022-10-29 VITALS — BP 120/60 | HR 82 | Ht 65.0 in | Wt 144.0 lb

## 2022-10-29 DIAGNOSIS — E782 Mixed hyperlipidemia: Secondary | ICD-10-CM

## 2022-10-29 DIAGNOSIS — I10 Essential (primary) hypertension: Secondary | ICD-10-CM | POA: Diagnosis not present

## 2022-10-29 DIAGNOSIS — J309 Allergic rhinitis, unspecified: Secondary | ICD-10-CM | POA: Diagnosis not present

## 2022-10-29 DIAGNOSIS — R7302 Impaired glucose tolerance (oral): Secondary | ICD-10-CM | POA: Diagnosis not present

## 2022-10-29 MED ORDER — LOSARTAN POTASSIUM-HCTZ 100-25 MG PO TABS: 1.0000 | ORAL_TABLET | ORAL | 0 refills | Status: DC

## 2022-10-29 NOTE — Progress Notes (Signed)
Established Patient Office Visit  Subjective:  Patient ID: Cristina Martinez, female    DOB: 1940/09/11  Age: 82 y.o. MRN: [id number]  Chief Complaint  Patient presents with   Follow-up    4 month follow up    Patient comes in for her follow-up today.  She has been feeling well over the last few months.  However she complains of a tickle in her throat with postnasal drip especially when she lies down at night.  She admits to having some seasonal allergies.  She denies any headache or dizziness, no sore throat, no ear pain.  She does not have any chest congestion or cough. Patient does not have any heartburn, no nausea and no vomiting.  Suspect her postnasal drip is causing cough at night.  However we have to remember that she is also on valsartan/HCTZ. Patient noted some mild swelling of both her ankles especially as the day goes by. Will start antihistamine, Claritin 10 mg tablet once a day ,and monitor closely if her cough gets better or not. Patient is fasting for labs.    No other concerns at this time.   Past Medical History:  Diagnosis Date   Arthritis    Headache    H/O MIGRAINES   Hypercalcemia    Hyperlipidemia    Hypertension    PONV (postoperative nausea and vomiting)    Wears dentures    full upper, partial lower    Past Surgical History:  Procedure Laterality Date   ABDOMINAL HYSTERECTOMY     CATARACT EXTRACTION Right    COLONOSCOPY     ECTROPION REPAIR Right 03/04/2020   Procedure: ECTROPION REPAIR, EXTENSIVE RIGHT EYE CONJUNCTIVOPLASTY RIGHT EYE HIGH TEMP HANDHELD CAUTERY;  Surgeon: Imagene Riches, MD;  Location: [facility];  Service: Ophthalmology;  Laterality: Right;   PARATHYROIDECTOMY N/A 08/29/2020   Procedure: PARATHYROIDECTOMY with RNFA to assist;  Surgeon: Duanne Guess, MD;  Location: [facility];  Service: General;  Laterality: N/A;    Social History   Socioeconomic History   Marital status: Widowed    Spouse name: Not on file   Number of  children: Not on file   Years of education: Not on file   Highest education level: Not on file  Occupational History   Not on file  Tobacco Use   Smoking status: Former    Packs/day: 1.00    Years: 15.00    Additional pack years: 0.00    Total pack years: 15.00    Types: Cigarettes    Quit date: 28    Years since quitting: 29.3   Smokeless tobacco: Never  Vaping Use   Vaping Use: Never used  Substance and Sexual Activity   Alcohol use: Not Currently   Drug use: Never   Sexual activity: Not on file  Other Topics Concern   Not on file  Social History Narrative   Not on file   Social Determinants of Health   Financial Resource Strain: Not on file  Food Insecurity: Not on file  Transportation Needs: Not on file  Physical Activity: Not on file  Stress: Not on file  Social Connections: Not on file  Intimate Partner Violence: Not on file    Family History  Problem Relation Age of Onset   Breast cancer Sister    Heart failure Mother    Stomach cancer Father    Breast cancer Sister     Allergies  Allergen Reactions   Valium [Diazepam] Nausea And Vomiting  Review of Systems  Constitutional:  Negative for chills, diaphoresis, fever, malaise/fatigue and weight loss.  HENT:  Negative for congestion, ear discharge, ear pain, hearing loss, nosebleeds, sinus pain and sore throat.   Eyes:  Negative for blurred vision, double vision, photophobia, pain, discharge and redness.  Respiratory:  Negative for cough, shortness of breath, wheezing and stridor.   Cardiovascular:  Negative for chest pain, palpitations, orthopnea, leg swelling and PND.  Gastrointestinal:  Negative for blood in stool, constipation, diarrhea, heartburn and nausea.  Genitourinary:  Negative for dysuria, frequency and urgency.  Musculoskeletal:  Negative for falls, joint pain, myalgias and neck pain.  Neurological:  Negative for dizziness, sensory change, speech change, seizures, loss of consciousness,  weakness and headaches.  Psychiatric/Behavioral:  Negative for depression and memory loss. The patient is not nervous/anxious.        Objective:   BP 120/60   Pulse 82   Ht 5\' 5"  (1.651 m)   Wt 144 lb (65.3 kg)   SpO2 98%   BMI 23.96 kg/m   Vitals:   10/29/22 1257  BP: 120/60  Pulse: 82  Height: 5\' 5"  (1.651 m)  Weight: 144 lb (65.3 kg)  SpO2: 98%  BMI (Calculated): 23.96    Physical Exam Constitutional:      Appearance: Normal appearance.  Cardiovascular:     Rate and Rhythm: Normal rate and regular rhythm.     Pulses: Normal pulses.     Heart sounds: Normal heart sounds.  Pulmonary:     Effort: Pulmonary effort is normal.     Breath sounds: Normal breath sounds. No wheezing, rhonchi or rales.  Abdominal:     General: Abdomen is flat.     Palpations: Abdomen is soft.  Musculoskeletal:        General: Normal range of motion.     Cervical back: Normal range of motion and neck supple.  Neurological:     General: No focal deficit present.     Mental Status: She is alert and oriented to person, place, and time.  Psychiatric:        Mood and Affect: Mood normal.        Behavior: Behavior normal.      No results found for any visits on 10/29/22.      Assessment & Plan:  Patient will get her fasting blood work today. She will start taking Claritin once a day.  Also advised to wear compression stockings. Problem List Items Addressed This Visit     Essential hypertension, benign - Primary   Relevant Medications   losartan-hydrochlorothiazide (HYZAAR) 100-25 MG tablet   Other Relevant Orders   CMP14+EGFR   Impaired glucose tolerance   Relevant Orders   Hemoglobin A1c   Mixed hyperlipidemia   Relevant Medications   losartan-hydrochlorothiazide (HYZAAR) 100-25 MG tablet   Other Relevant Orders   Lipid Panel w/o Chol/HDL Ratio   Allergic rhinitis   Relevant Orders   CBC With Differential    Return in about 2 weeks (around 11/12/2022).   Total time  spent: 30 minutes  Margaretann Loveless, MD  10/29/2022

## 2022-10-30 LAB — CBC WITH DIFFERENTIAL
Basophils Absolute: 0.1 10*3/uL (ref 0.0–0.2)
Basos: 1 %
EOS (ABSOLUTE): 0.1 10*3/uL (ref 0.0–0.4)
Eos: 2 %
Hematocrit: 34.9 % (ref 34.0–46.6)
Hemoglobin: 12 g/dL (ref 11.1–15.9)
Immature Grans (Abs): 0 10*3/uL (ref 0.0–0.1)
Immature Granulocytes: 0 %
Lymphocytes Absolute: 1.6 10*3/uL (ref 0.7–3.1)
Lymphs: 29 %
MCH: 30.1 pg (ref 26.6–33.0)
MCHC: 34.4 g/dL (ref 31.5–35.7)
MCV: 88 fL (ref 79–97)
Monocytes Absolute: 0.6 10*3/uL (ref 0.1–0.9)
Monocytes: 10 %
Neutrophils Absolute: 3.4 10*3/uL (ref 1.4–7.0)
Neutrophils: 58 %
RBC: 3.99 x10E6/uL (ref 3.77–5.28)
RDW: 13.1 % (ref 11.7–15.4)
WBC: 5.7 10*3/uL (ref 3.4–10.8)

## 2022-10-30 LAB — CMP14+EGFR
ALT: 9 IU/L (ref 0–32)
AST: 17 IU/L (ref 0–40)
Albumin/Globulin Ratio: 1.5 (ref 1.2–2.2)
Albumin: 4.3 g/dL (ref 3.8–4.8)
Alkaline Phosphatase: 61 IU/L (ref 44–121)
BUN/Creatinine Ratio: 17 (ref 12–28)
BUN: 16 mg/dL (ref 8–27)
Bilirubin Total: 0.5 mg/dL (ref 0.0–1.2)
CO2: 29 mmol/L (ref 20–29)
Calcium: 9.9 mg/dL (ref 8.7–10.3)
Chloride: 100 mmol/L (ref 96–106)
Creatinine, Ser: 0.94 mg/dL (ref 0.57–1.00)
Globulin, Total: 2.9 g/dL (ref 1.5–4.5)
Glucose: 97 mg/dL (ref 70–99)
Potassium: 3.9 mmol/L (ref 3.5–5.2)
Sodium: 142 mmol/L (ref 134–144)
Total Protein: 7.2 g/dL (ref 6.0–8.5)
eGFR: 61 mL/min/{1.73_m2} (ref >59)

## 2022-10-30 LAB — LIPID PANEL W/O CHOL/HDL RATIO
Cholesterol, Total: 155 mg/dL (ref 100–199)
HDL: 64 mg/dL (ref >39)
LDL Chol Calc (NIH): 76 mg/dL (ref 0–99)
Triglycerides: 76 mg/dL (ref 0–149)
VLDL Cholesterol Cal: 15 mg/dL (ref 5–40)

## 2022-10-30 LAB — HEMOGLOBIN A1C
Est. average glucose Bld gHb Est-mCnc: 134 mg/dL
Hgb A1c MFr Bld: 6.3 % — ABNORMAL HIGH (ref 4.8–5.6)

## 2022-11-12 ENCOUNTER — Encounter: Payer: Self-pay | Admitting: Internal Medicine

## 2022-11-12 ENCOUNTER — Ambulatory Visit (INDEPENDENT_AMBULATORY_CARE_PROVIDER_SITE_OTHER): Payer: Medicare HMO | Admitting: Internal Medicine

## 2022-11-12 VITALS — BP 118/72 | HR 96 | Ht 65.0 in | Wt 146.0 lb

## 2022-11-12 DIAGNOSIS — R052 Subacute cough: Secondary | ICD-10-CM | POA: Insufficient documentation

## 2022-11-12 DIAGNOSIS — R059 Cough, unspecified: Secondary | ICD-10-CM | POA: Insufficient documentation

## 2022-11-12 DIAGNOSIS — R7302 Impaired glucose tolerance (oral): Secondary | ICD-10-CM

## 2022-11-12 DIAGNOSIS — E782 Mixed hyperlipidemia: Secondary | ICD-10-CM | POA: Diagnosis not present

## 2022-11-12 DIAGNOSIS — R053 Chronic cough: Secondary | ICD-10-CM | POA: Insufficient documentation

## 2022-11-12 DIAGNOSIS — I10 Essential (primary) hypertension: Secondary | ICD-10-CM | POA: Diagnosis not present

## 2022-11-12 MED ORDER — HYDRALAZINE HCL 25 MG PO TABS: 25.0000 mg | ORAL_TABLET | Freq: Two times a day (BID) | ORAL | 2 refills | Status: DC

## 2022-11-12 MED ORDER — HYDROCHLOROTHIAZIDE 25 MG PO TABS: 25.0000 mg | ORAL_TABLET | Freq: Every day | ORAL | 3 refills | Status: DC

## 2022-11-12 NOTE — Progress Notes (Signed)
Established Patient Office Visit  Subjective:  Patient ID: Cristina Martinez, female    DOB: 31-Aug-1940  Age: 82 y.o. MRN: [id number]  Chief Complaint  Patient presents with   Follow-up    2 week follow up    Patient comes in follow up today. At last visit she had complained of a mild cough, due to postnasal drip, tickle in her throat.  Patient was started on antihistamine.  But today she states she is continuing to have bad cough which is annoying and does not let her relax.  Patient is on Losartan/hydrochlorothiazide.  Will d/c Losartan.  Send in a separate prescription for hydrochlorothiazide.  And start a small dose hydralazine 25 mg twice a day.  Patient will continue her Norvasc 10 mg/day.  Patient advised to monitor her blood pressure at home. Will start additional w/u if no improvement.     No other concerns at this time.   Past Medical History:  Diagnosis Date   Arthritis    Headache    H/O MIGRAINES   Hypercalcemia    Hyperlipidemia    Hypertension    PONV (postoperative nausea and vomiting)    Wears dentures    full upper, partial lower    Past Surgical History:  Procedure Laterality Date   ABDOMINAL HYSTERECTOMY     CATARACT EXTRACTION Right    COLONOSCOPY     ECTROPION REPAIR Right 03/04/2020   Procedure: ECTROPION REPAIR, EXTENSIVE RIGHT EYE CONJUNCTIVOPLASTY RIGHT EYE HIGH TEMP HANDHELD CAUTERY;  Surgeon: Imagene Riches, MD;  Location: [facility];  Service: Ophthalmology;  Laterality: Right;   PARATHYROIDECTOMY N/A 08/29/2020   Procedure: PARATHYROIDECTOMY with RNFA to assist;  Surgeon: Duanne Guess, MD;  Location: [facility];  Service: General;  Laterality: N/A;    Social History   Socioeconomic History   Marital status: Widowed    Spouse name: Not on file   Number of children: Not on file   Years of education: Not on file   Highest education level: Not on file  Occupational History   Not on file  Tobacco Use   Smoking status: Former     Packs/day: 1.00    Years: 15.00    Additional pack years: 0.00    Total pack years: 15.00    Types: Cigarettes    Quit date: 34    Years since quitting: 29.3   Smokeless tobacco: Never  Vaping Use   Vaping Use: Never used  Substance and Sexual Activity   Alcohol use: Not Currently   Drug use: Never   Sexual activity: Not on file  Other Topics Concern   Not on file  Social History Narrative   Not on file   Social Determinants of Health   Financial Resource Strain: Not on file  Food Insecurity: Not on file  Transportation Needs: Not on file  Physical Activity: Not on file  Stress: Not on file  Social Connections: Not on file  Intimate Partner Violence: Not on file    Family History  Problem Relation Age of Onset   Breast cancer Sister    Heart failure Mother    Stomach cancer Father    Breast cancer Sister     Allergies  Allergen Reactions   Valium [Diazepam] Nausea And Vomiting    Review of Systems  Constitutional:  Negative for chills, diaphoresis, fever, malaise/fatigue and weight loss.  HENT:  Negative for congestion, ear discharge, ear pain, hearing loss, nosebleeds, sinus pain, sore throat and tinnitus.  Eyes: Negative.   Respiratory:  Positive for cough. Negative for sputum production, shortness of breath, wheezing and stridor.   Cardiovascular:  Negative for chest pain, palpitations, orthopnea, claudication, leg swelling and PND.  Gastrointestinal:  Negative for abdominal pain, blood in stool, constipation, diarrhea, heartburn, melena, nausea and vomiting.  Genitourinary:  Negative for dysuria, frequency, hematuria and urgency.  Musculoskeletal: Negative.   Neurological:  Negative for dizziness, sensory change, speech change, focal weakness, weakness and headaches.  Psychiatric/Behavioral:  Negative for depression, memory loss, substance abuse and suicidal ideas. The patient is not nervous/anxious and does not have insomnia.        Objective:   BP  118/72   Pulse 96   Ht 5\' 5"  (1.651 m)   Wt 146 lb (66.2 kg)   SpO2 95%   BMI 24.30 kg/m   Vitals:   11/12/22 1315  BP: 118/72  Pulse: 96  Height: 5\' 5"  (1.651 m)  Weight: 146 lb (66.2 kg)  SpO2: 95%  BMI (Calculated): 24.3    Physical Exam Vitals reviewed.  Constitutional:      Appearance: Normal appearance.  HENT:     Head: Normocephalic.     Right Ear: Tympanic membrane normal.     Left Ear: Tympanic membrane normal.     Nose: Nose normal.     Mouth/Throat:     Mouth: Mucous membranes are moist.     Pharynx: Oropharynx is clear. No oropharyngeal exudate or posterior oropharyngeal erythema.  Cardiovascular:     Rate and Rhythm: Normal rate and regular rhythm.     Pulses: Normal pulses.     Heart sounds: Normal heart sounds.  Pulmonary:     Effort: Pulmonary effort is normal.     Breath sounds: Normal breath sounds. No wheezing, rhonchi or rales.  Abdominal:     General: Bowel sounds are normal. There is no distension.     Palpations: Abdomen is soft. There is no mass.     Tenderness: There is no guarding.     Hernia: No hernia is present.  Musculoskeletal:        General: Normal range of motion.     Cervical back: Normal range of motion and neck supple.  Skin:    General: Skin is warm and dry.  Neurological:     General: No focal deficit present.     Mental Status: She is alert and oriented to person, place, and time.  Psychiatric:        Mood and Affect: Mood normal.        Behavior: Behavior normal.      No results found for any visits on 11/12/22.  Recent Results (from the past 2160 hour(s))  CMP14+EGFR     Status: None   Collection Time: 10/29/22  1:34 PM  Result Value Ref Range   Glucose 97 70 - 99 mg/dL   BUN 16 8 - 27 mg/dL   Creatinine, Ser 1.61 0.57 - 1.00 mg/dL   eGFR 61 >09 UE/AVW/0.98   BUN/Creatinine Ratio 17 12 - 28   Sodium 142 134 - 144 mmol/L   Potassium 3.9 3.5 - 5.2 mmol/L   Chloride 100 96 - 106 mmol/L   CO2 29 20 - 29  mmol/L   Calcium 9.9 8.7 - 10.3 mg/dL   Total Protein 7.2 6.0 - 8.5 g/dL   Albumin 4.3 3.8 - 4.8 g/dL   Globulin, Total 2.9 1.5 - 4.5 g/dL   Albumin/Globulin Ratio 1.5 1.2 - 2.2  Bilirubin Total 0.5 0.0 - 1.2 mg/dL   Alkaline Phosphatase 61 44 - 121 IU/L   AST 17 0 - 40 IU/L   ALT 9 0 - 32 IU/L  CBC With Differential     Status: None   Collection Time: 10/29/22  1:34 PM  Result Value Ref Range   WBC 5.7 3.4 - 10.8 x10E3/uL   RBC 3.99 3.77 - 5.28 x10E6/uL   Hemoglobin 12.0 11.1 - 15.9 g/dL   Hematocrit 81.1 91.4 - 46.6 %   MCV 88 79 - 97 fL   MCH 30.1 26.6 - 33.0 pg   MCHC 34.4 31.5 - 35.7 g/dL   RDW 78.2 95.6 - 21.3 %   Neutrophils 58 Not Estab. %   Lymphs 29 Not Estab. %   Monocytes 10 Not Estab. %   Eos 2 Not Estab. %   Basos 1 Not Estab. %   Neutrophils Absolute 3.4 1.4 - 7.0 x10E3/uL   Lymphocytes Absolute 1.6 0.7 - 3.1 x10E3/uL   Monocytes Absolute 0.6 0.1 - 0.9 x10E3/uL   EOS (ABSOLUTE) 0.1 0.0 - 0.4 x10E3/uL   Basophils Absolute 0.1 0.0 - 0.2 x10E3/uL   Immature Granulocytes 0 Not Estab. %   Immature Grans (Abs) 0.0 0.0 - 0.1 x10E3/uL  Lipid Panel w/o Chol/HDL Ratio     Status: None   Collection Time: 10/29/22  1:34 PM  Result Value Ref Range   Cholesterol, Total 155 100 - 199 mg/dL   Triglycerides 76 0 - 149 mg/dL   HDL 64 >08 mg/dL   VLDL Cholesterol Cal 15 5 - 40 mg/dL   LDL Chol Calc (NIH) 76 0 - 99 mg/dL  Hemoglobin M5H     Status: Abnormal   Collection Time: 10/29/22  1:34 PM  Result Value Ref Range   Hgb A1c MFr Bld 6.3 (H) 4.8 - 5.6 %    Comment:          Prediabetes: 5.7 - 6.4          Diabetes: >6.4          Glycemic control for adults with diabetes: <7.0    Est. average glucose Bld gHb Est-mCnc 134 mg/dL      Assessment & Plan:  Patient advised to stop valsartan.  Her medications are being adjusted. Problem List Items Addressed This Visit     Essential hypertension, benign - Primary   Relevant Medications   hydrALAZINE (APRESOLINE) 25 MG  tablet   hydrochlorothiazide (HYDRODIURIL) 25 MG tablet   Impaired glucose tolerance   Mixed hyperlipidemia   Relevant Medications   hydrALAZINE (APRESOLINE) 25 MG tablet   hydrochlorothiazide (HYDRODIURIL) 25 MG tablet   Cough in adult patient    Return in about 2 weeks (around 11/26/2022).   Total time spent: 25 minutes  Margaretann Loveless, MD  11/12/2022

## 2022-11-26 ENCOUNTER — Ambulatory Visit (INDEPENDENT_AMBULATORY_CARE_PROVIDER_SITE_OTHER): Payer: Medicare HMO | Admitting: Internal Medicine

## 2022-11-26 ENCOUNTER — Ambulatory Visit (INDEPENDENT_AMBULATORY_CARE_PROVIDER_SITE_OTHER): Payer: Medicare HMO

## 2022-11-26 VITALS — BP 110/72 | HR 99 | Ht 65.0 in | Wt 141.4 lb

## 2022-11-26 DIAGNOSIS — E782 Mixed hyperlipidemia: Secondary | ICD-10-CM

## 2022-11-26 DIAGNOSIS — K219 Gastro-esophageal reflux disease without esophagitis: Secondary | ICD-10-CM | POA: Diagnosis not present

## 2022-11-26 DIAGNOSIS — R052 Subacute cough: Secondary | ICD-10-CM

## 2022-11-26 DIAGNOSIS — J301 Allergic rhinitis due to pollen: Secondary | ICD-10-CM | POA: Diagnosis not present

## 2022-11-26 DIAGNOSIS — I10 Essential (primary) hypertension: Secondary | ICD-10-CM | POA: Diagnosis not present

## 2022-11-26 MED ORDER — PANTOPRAZOLE SODIUM 40 MG PO TBEC: 40.0000 mg | DELAYED_RELEASE_TABLET | Freq: Every day | ORAL | 1 refills | Status: DC

## 2022-11-27 ENCOUNTER — Encounter: Payer: Self-pay | Admitting: Internal Medicine

## 2022-11-27 DIAGNOSIS — K219 Gastro-esophageal reflux disease without esophagitis: Secondary | ICD-10-CM | POA: Insufficient documentation

## 2022-11-27 NOTE — Progress Notes (Signed)
Established Patient Office Visit  Subjective:  Patient ID: Cristina Martinez, female    DOB: 01-22-1941  Age: 82 y.o. MRN: [id number]  Chief Complaint  Patient presents with   Follow-up    2 week follow up    Patient comes in for her follow-up today.  Her blood pressure medications were adjusted so as to take out the Valsartan as it was suspected to being causing a chronic dry cough.  Hydralazine was added to her existing Amlodipine. Patient today states that when she took Hydralazine she had severe body aches so she decided to stop it herself.  Today her blood pressure is 110/72, and she is only taking Amlodipine 10 mg/day.  She is feeling much better and does not have any more significant aches or pains in her muscles. Patient has been on antihistamines with no effect and she continued to have a dry cough.  Her chest x-ray will be done today.  Will add a PPI to treat possible acid reflux. Will consider lung function studies and adding an inhaler if not better by next visit.    No other concerns at this time.   Past Medical History:  Diagnosis Date   Arthritis    Headache    H/O MIGRAINES   Hypercalcemia    Hyperlipidemia    Hypertension    PONV (postoperative nausea and vomiting)    Wears dentures    full upper, partial lower    Past Surgical History:  Procedure Laterality Date   ABDOMINAL HYSTERECTOMY     CATARACT EXTRACTION Right    COLONOSCOPY     ECTROPION REPAIR Right 03/04/2020   Procedure: ECTROPION REPAIR, EXTENSIVE RIGHT EYE CONJUNCTIVOPLASTY RIGHT EYE HIGH TEMP HANDHELD CAUTERY;  Surgeon: Imagene Riches, MD;  Location: [facility];  Service: Ophthalmology;  Laterality: Right;   PARATHYROIDECTOMY N/A 08/29/2020   Procedure: PARATHYROIDECTOMY with RNFA to assist;  Surgeon: Duanne Guess, MD;  Location: [facility];  Service: General;  Laterality: N/A;    Social History   Socioeconomic History   Marital status: Widowed    Spouse name: Not on file   Number  of children: Not on file   Years of education: Not on file   Highest education level: Not on file  Occupational History   Not on file  Tobacco Use   Smoking status: Former    Packs/day: 1.00    Years: 15.00    Additional pack years: 0.00    Total pack years: 15.00    Types: Cigarettes    Quit date: 46    Years since quitting: 29.3   Smokeless tobacco: Never  Vaping Use   Vaping Use: Never used  Substance and Sexual Activity   Alcohol use: Not Currently   Drug use: Never   Sexual activity: Not on file  Other Topics Concern   Not on file  Social History Narrative   Not on file   Social Determinants of Health   Financial Resource Strain: Not on file  Food Insecurity: Not on file  Transportation Needs: Not on file  Physical Activity: Not on file  Stress: Not on file  Social Connections: Not on file  Intimate Partner Violence: Not on file    Family History  Problem Relation Age of Onset   Breast cancer Sister    Heart failure Mother    Stomach cancer Father    Breast cancer Sister     Allergies  Allergen Reactions   Valium [Diazepam] Nausea And Vomiting  Review of Systems  Constitutional:  Negative for chills, diaphoresis, malaise/fatigue and weight loss.  HENT:  Negative for congestion, ear pain, hearing loss, sinus pain and sore throat.   Eyes: Negative.   Respiratory:  Positive for cough. Negative for hemoptysis, sputum production, shortness of breath, wheezing and stridor.   Cardiovascular:  Negative for chest pain, palpitations and leg swelling.  Gastrointestinal:  Positive for heartburn and melena. Negative for abdominal pain, blood in stool, constipation, diarrhea, nausea and vomiting.  Genitourinary: Negative.  Negative for dysuria, frequency, hematuria and urgency.  Musculoskeletal:  Negative for back pain, falls, joint pain, myalgias and neck pain.  Skin: Negative.   Neurological:  Negative for dizziness, focal weakness, loss of consciousness,  weakness and headaches.  Psychiatric/Behavioral: Negative.         Objective:   BP 110/72   Pulse 99   Ht 5\' 5"  (1.651 m)   Wt 141 lb 6.4 oz (64.1 kg)   SpO2 96%   BMI 23.53 kg/m   Vitals:   11/26/22 1308  BP: 110/72  Pulse: 99  Height: 5\' 5"  (1.651 m)  Weight: 141 lb 6.4 oz (64.1 kg)  SpO2: 96%  BMI (Calculated): 23.53    Physical Exam Vitals and nursing note reviewed.  Constitutional:      Appearance: Normal appearance.  HENT:     Head: Normocephalic and atraumatic.  Cardiovascular:     Rate and Rhythm: Normal rate and regular rhythm.     Pulses: Normal pulses.     Heart sounds: Normal heart sounds. No murmur heard. Pulmonary:     Effort: Pulmonary effort is normal.     Breath sounds: Normal breath sounds. No wheezing, rhonchi or rales.  Abdominal:     General: Bowel sounds are normal. There is no distension.     Palpations: Abdomen is soft. There is no mass.     Tenderness: There is no abdominal tenderness. There is no right CVA tenderness, left CVA tenderness or guarding.  Musculoskeletal:        General: Normal range of motion.     Cervical back: Normal range of motion and neck supple.     Right lower leg: No edema.     Left lower leg: No edema.  Skin:    General: Skin is warm and dry.  Neurological:     General: No focal deficit present.     Mental Status: She is alert and oriented to person, place, and time.  Psychiatric:        Mood and Affect: Mood normal.        Behavior: Behavior normal.      No results found for any visits on 11/26/22.      Assessment & Plan:  Continue current meds.  Add PPI.  Monitor for dry cough.  Check chest x-ray Problem List Items Addressed This Visit     Essential hypertension, benign   Mixed hyperlipidemia   Allergic rhinitis   Subacute cough - Primary   Relevant Orders   DG Chest 2 View (Completed)   Gastroesophageal reflux disease without esophagitis   Relevant Medications   pantoprazole (PROTONIX) 40  MG tablet    Follow up in 2 months  Total time spent: 25 minutes  Margaretann Loveless, MD  11/26/2022

## 2022-12-31 ENCOUNTER — Ambulatory Visit (INDEPENDENT_AMBULATORY_CARE_PROVIDER_SITE_OTHER): Payer: Medicare HMO | Admitting: Internal Medicine

## 2022-12-31 ENCOUNTER — Encounter: Payer: Self-pay | Admitting: Internal Medicine

## 2022-12-31 VITALS — BP 128/72 | HR 96 | Ht 65.0 in | Wt 136.6 lb

## 2022-12-31 DIAGNOSIS — E782 Mixed hyperlipidemia: Secondary | ICD-10-CM | POA: Diagnosis not present

## 2022-12-31 DIAGNOSIS — I10 Essential (primary) hypertension: Secondary | ICD-10-CM | POA: Diagnosis not present

## 2022-12-31 DIAGNOSIS — R634 Abnormal weight loss: Secondary | ICD-10-CM | POA: Diagnosis not present

## 2022-12-31 DIAGNOSIS — R059 Cough, unspecified: Secondary | ICD-10-CM

## 2022-12-31 MED ORDER — BENZONATATE 100 MG PO CAPS: 100.0000 mg | ORAL_CAPSULE | Freq: Three times a day (TID) | ORAL | 1 refills | Status: DC | PRN

## 2022-12-31 MED ORDER — AZITHROMYCIN 250 MG PO TABS: ORAL_TABLET | ORAL | 0 refills | Status: AC

## 2022-12-31 NOTE — Progress Notes (Signed)
Established Patient Office Visit  Subjective:  Patient ID: Cristina Martinez, female    DOB: [birth year]  Age: 82 y.o. MRN: [id number]  Chief Complaint  Patient presents with   Follow-up    5 week follow up    Patient in office for 5 week follow up. Patient continues to have a dry cough. Is taking and tolerating Protonix. Not taking an anti-histamine regularly, but will resume. Takes Robitussin. No fever or chills, no colored sputum. Now off Valsartan and maintaining good BP. Chest xray was normal.  Patient reports lack of appetite resulting in a 10 lb weight loss over the past 6 weeks.  Will get CT chest, Pulmonary consult - PFTs. May need an inhaler.    No other concerns at this time.   Past Medical History:  Diagnosis Date   Arthritis    Headache    H/O MIGRAINES   Hypercalcemia    Hyperlipidemia    Hypertension    PONV (postoperative nausea and vomiting)    Wears dentures    full upper, partial lower    Past Surgical History:  Procedure Laterality Date   ABDOMINAL HYSTERECTOMY     CATARACT EXTRACTION Right    COLONOSCOPY     ECTROPION REPAIR Right 03/04/2020   Procedure: ECTROPION REPAIR, EXTENSIVE RIGHT EYE CONJUNCTIVOPLASTY RIGHT EYE HIGH TEMP HANDHELD CAUTERY;  Surgeon: Imagene Riches, MD;  Location: [facility];  Service: Ophthalmology;  Laterality: Right;   PARATHYROIDECTOMY N/A 08/29/2020   Procedure: PARATHYROIDECTOMY with RNFA to assist;  Surgeon: Duanne Guess, MD;  Location: [facility];  Service: General;  Laterality: N/A;    Social History   Socioeconomic History   Marital status: Widowed    Spouse name: Not on file   Number of children: Not on file   Years of education: Not on file   Highest education level: Not on file  Occupational History   Not on file  Tobacco Use   Smoking status: Former    Packs/day: 1.00    Years: 15.00    Additional pack years: 0.00    Total pack years: 15.00    Types: Cigarettes    Quit date: 84    Years  since quitting: 29.4   Smokeless tobacco: Never  Vaping Use   Vaping Use: Never used  Substance and Sexual Activity   Alcohol use: Not Currently   Drug use: Never   Sexual activity: Not on file  Other Topics Concern   Not on file  Social History Narrative   Not on file   Social Determinants of Health   Financial Resource Strain: Not on file  Food Insecurity: Not on file  Transportation Needs: Not on file  Physical Activity: Not on file  Stress: Not on file  Social Connections: Not on file  Intimate Partner Violence: Not on file    Family History  Problem Relation Age of Onset   Breast cancer Sister    Heart failure Mother    Stomach cancer Father    Breast cancer Sister     Allergies  Allergen Reactions   Valium [Diazepam] Nausea And Vomiting    Review of Systems  Constitutional:  Positive for malaise/fatigue and weight loss. Negative for chills, diaphoresis and fever.  HENT: Negative.  Negative for congestion and sore throat.   Eyes: Negative.   Respiratory:  Positive for cough. Negative for sputum production, shortness of breath and wheezing.   Cardiovascular: Negative.  Negative for chest pain, leg swelling and PND.  Gastrointestinal: Negative.  Negative for blood in stool, constipation, heartburn, melena and nausea.  Genitourinary: Negative.   Musculoskeletal: Negative.  Negative for joint pain.  Skin: Negative.   Neurological: Negative.   Endo/Heme/Allergies: Negative.   Psychiatric/Behavioral: Negative.  Negative for depression. The patient is not nervous/anxious.        Objective:   BP 128/72   Pulse 96   Ht 5\' 5"  (1.651 m)   Wt 136 lb 9.6 oz (62 kg)   SpO2 95%   BMI 22.73 kg/m   Vitals:   12/31/22 1311  BP: 128/72  Pulse: 96  Height: 5\' 5"  (1.651 m)  Weight: 136 lb 9.6 oz (62 kg)  SpO2: 95%  BMI (Calculated): 22.73    Physical Exam Vitals and nursing note reviewed.  Constitutional:      Appearance: Normal appearance.  HENT:      Head: Normocephalic and atraumatic.     Nose: Nose normal.     Mouth/Throat:     Mouth: Mucous membranes are moist.     Pharynx: Oropharynx is clear. No oropharyngeal exudate or posterior oropharyngeal erythema.  Cardiovascular:     Rate and Rhythm: Normal rate and regular rhythm.     Heart sounds: Normal heart sounds. No murmur heard. Pulmonary:     Effort: Pulmonary effort is normal.     Breath sounds: Normal breath sounds. No wheezing, rhonchi or rales.  Chest:     Chest wall: No tenderness.  Abdominal:     General: Abdomen is flat.     Palpations: Abdomen is soft.  Musculoskeletal:        General: Normal range of motion.     Cervical back: Normal range of motion.  Skin:    General: Skin is warm and dry.  Neurological:     General: No focal deficit present.     Mental Status: She is alert and oriented to person, place, and time.  Psychiatric:        Mood and Affect: Mood normal.        Behavior: Behavior normal.      No results found for any visits on 12/31/22.  Recent Results (from the past 2160 hour(s))  CMP14+EGFR     Status: None   Collection Time: 10/29/22  1:34 PM  Result Value Ref Range   Glucose 97 70 - 99 mg/dL   BUN 16 8 - 27 mg/dL   Creatinine, Ser 8.11 0.57 - 1.00 mg/dL   eGFR 61 >91 YN/WGN/5.62   BUN/Creatinine Ratio 17 12 - 28   Sodium 142 134 - 144 mmol/L   Potassium 3.9 3.5 - 5.2 mmol/L   Chloride 100 96 - 106 mmol/L   CO2 29 20 - 29 mmol/L   Calcium 9.9 8.7 - 10.3 mg/dL   Total Protein 7.2 6.0 - 8.5 g/dL   Albumin 4.3 3.8 - 4.8 g/dL   Globulin, Total 2.9 1.5 - 4.5 g/dL   Albumin/Globulin Ratio 1.5 1.2 - 2.2   Bilirubin Total 0.5 0.0 - 1.2 mg/dL   Alkaline Phosphatase 61 44 - 121 IU/L   AST 17 0 - 40 IU/L   ALT 9 0 - 32 IU/L  CBC With Differential     Status: None   Collection Time: 10/29/22  1:34 PM  Result Value Ref Range   WBC 5.7 3.4 - 10.8 x10E3/uL   RBC 3.99 3.77 - 5.28 x10E6/uL   Hemoglobin 12.0 11.1 - 15.9 g/dL   Hematocrit 13.0  86.5 - 46.6 %  MCV 88 79 - 97 fL   MCH 30.1 26.6 - 33.0 pg   MCHC 34.4 31.5 - 35.7 g/dL   RDW 16.1 09.6 - 04.5 %   Neutrophils 58 Not Estab. %   Lymphs 29 Not Estab. %   Monocytes 10 Not Estab. %   Eos 2 Not Estab. %   Basos 1 Not Estab. %   Neutrophils Absolute 3.4 1.4 - 7.0 x10E3/uL   Lymphocytes Absolute 1.6 0.7 - 3.1 x10E3/uL   Monocytes Absolute 0.6 0.1 - 0.9 x10E3/uL   EOS (ABSOLUTE) 0.1 0.0 - 0.4 x10E3/uL   Basophils Absolute 0.1 0.0 - 0.2 x10E3/uL   Immature Granulocytes 0 Not Estab. %   Immature Grans (Abs) 0.0 0.0 - 0.1 x10E3/uL  Lipid Panel w/o Chol/HDL Ratio     Status: None   Collection Time: 10/29/22  1:34 PM  Result Value Ref Range   Cholesterol, Total 155 100 - 199 mg/dL   Triglycerides 76 0 - 149 mg/dL   HDL 64 >40 mg/dL   VLDL Cholesterol Cal 15 5 - 40 mg/dL   LDL Chol Calc (NIH) 76 0 - 99 mg/dL  Hemoglobin J8J     Status: Abnormal   Collection Time: 10/29/22  1:34 PM  Result Value Ref Range   Hgb A1c MFr Bld 6.3 (H) 4.8 - 5.6 %    Comment:          Prediabetes: 5.7 - 6.4          Diabetes: >6.4          Glycemic control for adults with diabetes: <7.0    Est. average glucose Bld gHb Est-mCnc 134 mg/dL      Assessment & Plan:  Patient continues to experience a dry cough. Chest xray was normal. PPI not helping. Unintentional weight loss, will order a CT chest to rule out infectious /inflammatory disease process. Will refer to pulmonary to assess for asthma/COPD.  Patient has taken Robitussin. Trial of zpak, tessalon perles.   Problem List Items Addressed This Visit     Essential hypertension, benign   Mixed hyperlipidemia   Cough in adult - Primary   Relevant Medications   benzonatate (TESSALON PERLES) 100 MG capsule   azithromycin (ZITHROMAX) 250 MG tablet   Other Relevant Orders   CT CHEST W CONTRAST   Ambulatory referral to Pulmonology   Weight loss, unintentional    Return in about 2 weeks (around 01/14/2023).   Total time spent:   Margaretann Loveless, MD  12/31/2022   This document may have been prepared by [facility] Voice Recognition software and as such may include unintentional dictation errors.

## 2023-01-07 ENCOUNTER — Ambulatory Visit: Payer: Medicare HMO

## 2023-01-07 DIAGNOSIS — R059 Cough, unspecified: Secondary | ICD-10-CM

## 2023-01-07 DIAGNOSIS — R052 Subacute cough: Secondary | ICD-10-CM | POA: Diagnosis not present

## 2023-01-07 MED ORDER — IOHEXOL 300 MG/ML  SOLN
100.0000 mL | Freq: Once | INTRAMUSCULAR | Status: AC | PRN
  Administered 2023-01-07: 100 mL via INTRAVENOUS

## 2023-01-10 ENCOUNTER — Other Ambulatory Visit: Payer: Self-pay | Admitting: Internal Medicine

## 2023-01-10 DIAGNOSIS — Z1231 Encounter for screening mammogram for malignant neoplasm of breast: Secondary | ICD-10-CM

## 2023-01-14 ENCOUNTER — Encounter: Payer: Self-pay | Admitting: Internal Medicine

## 2023-01-14 ENCOUNTER — Ambulatory Visit (INDEPENDENT_AMBULATORY_CARE_PROVIDER_SITE_OTHER): Payer: Medicare HMO | Admitting: Internal Medicine

## 2023-01-14 VITALS — BP 110/70 | HR 105 | Ht 65.0 in | Wt 135.4 lb

## 2023-01-14 DIAGNOSIS — I10 Essential (primary) hypertension: Secondary | ICD-10-CM | POA: Diagnosis not present

## 2023-01-14 DIAGNOSIS — R053 Chronic cough: Secondary | ICD-10-CM | POA: Diagnosis not present

## 2023-01-14 DIAGNOSIS — K219 Gastro-esophageal reflux disease without esophagitis: Secondary | ICD-10-CM

## 2023-01-14 DIAGNOSIS — E782 Mixed hyperlipidemia: Secondary | ICD-10-CM

## 2023-01-14 DIAGNOSIS — J301 Allergic rhinitis due to pollen: Secondary | ICD-10-CM | POA: Diagnosis not present

## 2023-01-14 NOTE — Progress Notes (Signed)
Established Patient Office Visit  Subjective:  Patient ID: Cristina Martinez, female    DOB: May 30, 1941  Age: 82 y.o. MRN: [id number]  Chief Complaint  Patient presents with   Follow-up    2 week F/U    Patient comes in for her follow-up of cough.  She has completed Z-Pak and takes [employer] as needed .Her CT chest has been unremarkable.  She has not heard from the pulmonologist yet for the appointment.  Today she complains that her cough is now more coming from her throat.  She feels some dryness of her mouth and then followed by a tickle in the throat and then she has to cough.  She denies difficulty swallowing liquids or solids.  She is already on Protonix daily.  Also using her Flonase nasal spray and antihistamine daily.  Her blood pressure stays under good control off her valsartan. Will send a referral to ENT as well. No fevers no chills, no body aches.  Denies chest pain, no shortness of breath, no nausea or vomiting.    No other concerns at this time.   Past Medical History:  Diagnosis Date   Arthritis    Headache    H/O MIGRAINES   Hypercalcemia    Hyperlipidemia    Hypertension    PONV (postoperative nausea and vomiting)    Wears dentures    full upper, partial lower    Past Surgical History:  Procedure Laterality Date   ABDOMINAL HYSTERECTOMY     CATARACT EXTRACTION Right    COLONOSCOPY     ECTROPION REPAIR Right 03/04/2020   Procedure: ECTROPION REPAIR, EXTENSIVE RIGHT EYE CONJUNCTIVOPLASTY RIGHT EYE HIGH TEMP HANDHELD CAUTERY;  Surgeon: Imagene Riches, MD;  Location: [facility];  Service: Ophthalmology;  Laterality: Right;   PARATHYROIDECTOMY N/A 08/29/2020   Procedure: PARATHYROIDECTOMY with RNFA to assist;  Surgeon: Duanne Guess, MD;  Location: [facility];  Service: General;  Laterality: N/A;    Social History   Socioeconomic History   Marital status: Widowed    Spouse name: Not on file   Number of children: Not on file   Years of  education: Not on file   Highest education level: Not on file  Occupational History   Not on file  Tobacco Use   Smoking status: Former    Packs/day: 1.00    Years: 15.00    Additional pack years: 0.00    Total pack years: 15.00    Types: Cigarettes    Quit date: 73    Years since quitting: 29.5   Smokeless tobacco: Never  Vaping Use   Vaping Use: Never used  Substance and Sexual Activity   Alcohol use: Not Currently   Drug use: Never   Sexual activity: Not on file  Other Topics Concern   Not on file  Social History Narrative   Not on file   Social Determinants of Health   Financial Resource Strain: Not on file  Food Insecurity: Not on file  Transportation Needs: Not on file  Physical Activity: Not on file  Stress: Not on file  Social Connections: Not on file  Intimate Partner Violence: Not on file    Family History  Problem Relation Age of Onset   Breast cancer Sister    Heart failure Mother    Stomach cancer Father    Breast cancer Sister     Allergies  Allergen Reactions   Valium [Diazepam] Nausea And Vomiting    Review of Systems  Constitutional: Negative.  Negative for chills, fever and malaise/fatigue.  HENT: Negative.  Negative for ear pain, hearing loss, sinus pain and tinnitus.   Eyes: Negative.   Respiratory: Negative.  Negative for cough, sputum production, shortness of breath, wheezing and stridor.   Cardiovascular: Negative.  Negative for chest pain, palpitations and leg swelling.  Gastrointestinal: Negative.  Negative for abdominal pain, constipation, diarrhea, heartburn, nausea and vomiting.  Genitourinary: Negative.  Negative for dysuria and flank pain.  Musculoskeletal: Negative.  Negative for joint pain, myalgias and neck pain.  Skin: Negative.   Neurological: Negative.  Negative for dizziness, tingling, sensory change and headaches.  Endo/Heme/Allergies: Negative.   Psychiatric/Behavioral: Negative.  Negative for depression, memory  loss and suicidal ideas. The patient is not nervous/anxious and does not have insomnia.        Objective:   BP 110/70   Pulse (!) 105   Ht 5\' 5"  (1.651 m)   Wt 135 lb 6.4 oz (61.4 kg)   SpO2 97%   BMI 22.53 kg/m   Vitals:   01/14/23 1303  BP: 110/70  Pulse: (!) 105  Height: 5\' 5"  (1.651 m)  Weight: 135 lb 6.4 oz (61.4 kg)  SpO2: 97%  BMI (Calculated): 22.53    Physical Exam Vitals and nursing note reviewed.  Constitutional:      Appearance: Normal appearance.  HENT:     Head: Normocephalic and atraumatic.     Nose: Nose normal.     Mouth/Throat:     Mouth: Mucous membranes are moist.     Pharynx: Oropharynx is clear.  Eyes:     Conjunctiva/sclera: Conjunctivae normal.     Pupils: Pupils are equal, round, and reactive to light.  Cardiovascular:     Rate and Rhythm: Normal rate and regular rhythm.     Pulses: Normal pulses.     Heart sounds: Normal heart sounds. No murmur heard. Pulmonary:     Effort: Pulmonary effort is normal.     Breath sounds: Normal breath sounds. No wheezing, rhonchi or rales.  Abdominal:     General: Bowel sounds are normal.     Palpations: Abdomen is soft.     Tenderness: There is no abdominal tenderness. There is no right CVA tenderness or left CVA tenderness.  Musculoskeletal:        General: Normal range of motion.     Cervical back: Normal range of motion.     Right lower leg: No edema.     Left lower leg: No edema.  Skin:    General: Skin is warm and dry.  Neurological:     General: No focal deficit present.     Mental Status: She is alert and oriented to person, place, and time.  Psychiatric:        Mood and Affect: Mood normal.        Behavior: Behavior normal.      No results found for any visits on 01/14/23.      Assessment & Plan:  Patient will get to see ENT and pulmonologist for her chronic cough now.  Advised to continue taking all her current medications.  May need an inhaler after her appointment with her  pulmonologist. Problem List Items Addressed This Visit     Essential hypertension, benign   Mixed hyperlipidemia   Allergic rhinitis   Chronic cough - Primary   Relevant Orders   Ambulatory referral to ENT   Gastroesophageal reflux disease without esophagitis    Return in about 6  weeks (around 02/25/2023).   Total time spent: 30 minutes  Margaretann Loveless, MD  01/14/2023   This document may have been prepared by [facility] Voice Recognition software and as such may include unintentional dictation errors.

## 2023-01-18 ENCOUNTER — Other Ambulatory Visit: Payer: Self-pay | Admitting: Internal Medicine

## 2023-01-18 DIAGNOSIS — K219 Gastro-esophageal reflux disease without esophagitis: Secondary | ICD-10-CM

## 2023-01-23 ENCOUNTER — Ambulatory Visit
Admission: RE | Admit: 2023-01-23 | Discharge: 2023-01-23 | Disposition: A | Discharge status: Home / Self Care | Payer: Medicare HMO | Source: Ambulatory Visit | Attending: Internal Medicine | Admitting: Internal Medicine

## 2023-01-23 DIAGNOSIS — Z1231 Encounter for screening mammogram for malignant neoplasm of breast: Secondary | ICD-10-CM | POA: Diagnosis not present

## 2023-01-29 ENCOUNTER — Other Ambulatory Visit: Payer: Self-pay | Admitting: Internal Medicine

## 2023-01-29 DIAGNOSIS — I10 Essential (primary) hypertension: Secondary | ICD-10-CM

## 2023-02-25 ENCOUNTER — Ambulatory Visit (INDEPENDENT_AMBULATORY_CARE_PROVIDER_SITE_OTHER): Payer: Medicare HMO | Admitting: Internal Medicine

## 2023-02-25 ENCOUNTER — Encounter: Payer: Self-pay | Admitting: Internal Medicine

## 2023-02-25 VITALS — BP 118/68 | HR 81 | Ht 65.0 in | Wt 135.8 lb

## 2023-02-25 DIAGNOSIS — R7303 Prediabetes: Secondary | ICD-10-CM

## 2023-02-25 DIAGNOSIS — I10 Essential (primary) hypertension: Secondary | ICD-10-CM | POA: Diagnosis not present

## 2023-02-25 DIAGNOSIS — J301 Allergic rhinitis due to pollen: Secondary | ICD-10-CM | POA: Diagnosis not present

## 2023-02-25 DIAGNOSIS — E782 Mixed hyperlipidemia: Secondary | ICD-10-CM | POA: Diagnosis not present

## 2023-02-25 NOTE — Progress Notes (Signed)
Established Patient Office Visit  Subjective:  Patient ID: Cristina Martinez, female    DOB: 1940-10-04  Age: 82 y.o. MRN: [id number]  Chief Complaint  Patient presents with   Follow-up    6 week follow up    Patient comes in for follow-up of her chronic dry cough.  She was supposed to see ENT but has not been called yet.  However today she says that her cough has resolved completely without any further interventions.  She is only taking the previous medications that were prescribed and is also trying to ease off them.  No chest pain or congestion, no sinus congestion.  There is no nasal discharge, and no fevers or chills.  She is currently off her valsartan and her blood pressure is still looking good.    No other concerns at this time.   Past Medical History:  Diagnosis Date   Arthritis    Headache    H/O MIGRAINES   Hypercalcemia    Hyperlipidemia    Hypertension    PONV (postoperative nausea and vomiting)    Wears dentures    full upper, partial lower    Past Surgical History:  Procedure Laterality Date   ABDOMINAL HYSTERECTOMY     CATARACT EXTRACTION Right    COLONOSCOPY     ECTROPION REPAIR Right 03/04/2020   Procedure: ECTROPION REPAIR, EXTENSIVE RIGHT EYE CONJUNCTIVOPLASTY RIGHT EYE HIGH TEMP HANDHELD CAUTERY;  Surgeon: Imagene Riches, MD;  Location: [facility];  Service: Ophthalmology;  Laterality: Right;   PARATHYROIDECTOMY N/A 08/29/2020   Procedure: PARATHYROIDECTOMY with RNFA to assist;  Surgeon: Duanne Guess, MD;  Location: [facility];  Service: General;  Laterality: N/A;    Social History   Socioeconomic History   Marital status: Widowed    Spouse name: Not on file   Number of children: Not on file   Years of education: Not on file   Highest education level: Not on file  Occupational History   Not on file  Tobacco Use   Smoking status: Former    Current packs/day: 0.00    Average packs/day: 1 pack/day for 15.0 years (15.0 ttl pk-yrs)     Types: Cigarettes    Start date: 60    Quit date: 65    Years since quitting: 29.6   Smokeless tobacco: Never  Vaping Use   Vaping status: Never Used  Substance and Sexual Activity   Alcohol use: Not Currently   Drug use: Never   Sexual activity: Not on file  Other Topics Concern   Not on file  Social History Narrative   Not on file   Social Determinants of Health   Financial Resource Strain: Not on file  Food Insecurity: Not on file  Transportation Needs: Not on file  Physical Activity: Not on file  Stress: Not on file  Social Connections: Not on file  Intimate Partner Violence: Not on file    Family History  Problem Relation Age of Onset   Breast cancer Sister    Heart failure Mother    Stomach cancer Father    Breast cancer Sister     Allergies  Allergen Reactions   Valium [Diazepam] Nausea And Vomiting    Review of Systems  Constitutional: Negative.  Negative for chills, fever, malaise/fatigue and weight loss.  HENT: Negative.  Negative for ear discharge and sore throat.   Eyes: Negative.   Respiratory: Negative.  Negative for cough, shortness of breath and stridor.   Cardiovascular: Negative.  Negative for chest pain, palpitations and leg swelling.  Gastrointestinal: Negative.  Negative for abdominal pain, constipation, diarrhea, heartburn, nausea and vomiting.  Genitourinary: Negative.  Negative for dysuria and flank pain.  Musculoskeletal: Negative.  Negative for joint pain and myalgias.  Skin: Negative.  Negative for rash.  Neurological: Negative.  Negative for dizziness and headaches.  Endo/Heme/Allergies: Negative.   Psychiatric/Behavioral: Negative.  Negative for depression and suicidal ideas. The patient is not nervous/anxious.        Objective:   BP 118/68   Pulse 81   Ht 5\' 5"  (1.651 m)   Wt 135 lb 12.8 oz (61.6 kg)   SpO2 96%   BMI 22.60 kg/m   Vitals:   02/25/23 1309  BP: 118/68  Pulse: 81  Height: 5\' 5"  (1.651 m)  Weight:  135 lb 12.8 oz (61.6 kg)  SpO2: 96%  BMI (Calculated): 22.6    Physical Exam Vitals and nursing note reviewed.  Constitutional:      Appearance: Normal appearance.  HENT:     Head: Normocephalic and atraumatic.     Nose: Nose normal.     Mouth/Throat:     Mouth: Mucous membranes are moist.     Pharynx: Oropharynx is clear.  Eyes:     Conjunctiva/sclera: Conjunctivae normal.     Pupils: Pupils are equal, round, and reactive to light.  Cardiovascular:     Rate and Rhythm: Normal rate and regular rhythm.     Pulses: Normal pulses.     Heart sounds: Normal heart sounds. No murmur heard. Pulmonary:     Effort: Pulmonary effort is normal.     Breath sounds: Normal breath sounds. No wheezing.  Abdominal:     General: Bowel sounds are normal.     Palpations: Abdomen is soft.     Tenderness: There is no abdominal tenderness. There is no right CVA tenderness or left CVA tenderness.  Musculoskeletal:        General: Normal range of motion.     Cervical back: Normal range of motion.     Right lower leg: No edema.     Left lower leg: No edema.  Skin:    General: Skin is warm and dry.  Neurological:     General: No focal deficit present.     Mental Status: She is alert and oriented to person, place, and time.  Psychiatric:        Mood and Affect: Mood normal.        Behavior: Behavior normal.      No results found for any visits on 02/25/23.  No results found for this or any previous visit (from the past 2160 hour(s)).    Assessment & Plan:  Patient will get her labs today.  Continue all medications as such.  Let us know if her cough returns.  It Problem List Items Addressed This Visit     Essential hypertension, benign - Primary   Relevant Orders   CMP14+EGFR   Mixed hyperlipidemia   Relevant Orders   Lipid Panel w/o Chol/HDL Ratio   Allergic rhinitis   Relevant Orders   CBC With Differential   Prediabetes   Relevant Orders   Hemoglobin A1c    Follow up in 3  months.   Total time spent: 30 minutes  Margaretann Loveless, MD  02/25/2023   This document may have been prepared by [facility] Voice Recognition software and as such may include unintentional dictation errors.

## 2023-02-26 ENCOUNTER — Other Ambulatory Visit: Payer: Self-pay | Admitting: Internal Medicine

## 2023-02-26 DIAGNOSIS — E876 Hypokalemia: Secondary | ICD-10-CM

## 2023-02-26 MED ORDER — POTASSIUM CHLORIDE ER 10 MEQ PO TBCR: 10.0000 meq | EXTENDED_RELEASE_TABLET | Freq: Once | ORAL | 3 refills | Status: DC

## 2023-02-27 ENCOUNTER — Other Ambulatory Visit: Payer: Self-pay

## 2023-02-27 DIAGNOSIS — E876 Hypokalemia: Secondary | ICD-10-CM

## 2023-02-27 MED ORDER — POTASSIUM CHLORIDE ER 10 MEQ PO TBCR: 10.0000 meq | EXTENDED_RELEASE_TABLET | Freq: Once | ORAL | 3 refills | Status: DC

## 2023-02-27 NOTE — Progress Notes (Signed)
Spoke with pt who verified DOB and verbalized understanding.

## 2023-03-04 ENCOUNTER — Other Ambulatory Visit: Payer: Self-pay

## 2023-03-04 DIAGNOSIS — E876 Hypokalemia: Secondary | ICD-10-CM

## 2023-03-05 ENCOUNTER — Other Ambulatory Visit: Payer: Self-pay | Admitting: Internal Medicine

## 2023-03-05 DIAGNOSIS — E876 Hypokalemia: Secondary | ICD-10-CM

## 2023-03-05 MED ORDER — POTASSIUM CHLORIDE ER 10 MEQ PO TBCR: 10.0000 meq | EXTENDED_RELEASE_TABLET | Freq: Every day | ORAL | 3 refills | Status: AC

## 2023-04-29 ENCOUNTER — Other Ambulatory Visit: Payer: Self-pay | Admitting: Internal Medicine

## 2023-05-20 ENCOUNTER — Other Ambulatory Visit: Payer: Self-pay | Admitting: Internal Medicine

## 2023-05-28 ENCOUNTER — Ambulatory Visit (INDEPENDENT_AMBULATORY_CARE_PROVIDER_SITE_OTHER): Payer: Medicare HMO | Admitting: Internal Medicine

## 2023-05-28 ENCOUNTER — Encounter: Payer: Self-pay | Admitting: Internal Medicine

## 2023-05-28 VITALS — BP 110/76 | HR 73 | Ht 65.0 in | Wt 132.6 lb

## 2023-05-28 DIAGNOSIS — E782 Mixed hyperlipidemia: Secondary | ICD-10-CM | POA: Diagnosis not present

## 2023-05-28 DIAGNOSIS — R7303 Prediabetes: Secondary | ICD-10-CM

## 2023-05-28 DIAGNOSIS — Z13820 Encounter for screening for osteoporosis: Secondary | ICD-10-CM | POA: Diagnosis not present

## 2023-05-28 DIAGNOSIS — I10 Essential (primary) hypertension: Secondary | ICD-10-CM | POA: Diagnosis not present

## 2023-05-28 DIAGNOSIS — K219 Gastro-esophageal reflux disease without esophagitis: Secondary | ICD-10-CM | POA: Diagnosis not present

## 2023-05-28 DIAGNOSIS — Z23 Encounter for immunization: Secondary | ICD-10-CM

## 2023-05-28 DIAGNOSIS — Z1211 Encounter for screening for malignant neoplasm of colon: Secondary | ICD-10-CM

## 2023-05-28 NOTE — Progress Notes (Signed)
Established Patient Office Visit  Subjective:  Patient ID: Cristina Martinez, female    DOB: Mar 16, 1941  Age: 82 y.o. MRN: [id number]  Chief Complaint  Patient presents with   Follow-up    3 month follow up    Patient is here for follow-up today.  She does not have any more dry cough or tickle in her throat.  Her blood pressure is normal on current medications.  She is fasting today for blood work.  Patient is due for Cologuard and a DEXA scan, will set it up. Flu vaccine today.    No other concerns at this time.   Past Medical History:  Diagnosis Date   Arthritis    Headache    H/O MIGRAINES   Hypercalcemia    Hyperlipidemia    Hypertension    PONV (postoperative nausea and vomiting)    Wears dentures    full upper, partial lower    Past Surgical History:  Procedure Laterality Date   ABDOMINAL HYSTERECTOMY     CATARACT EXTRACTION Right    COLONOSCOPY     ECTROPION REPAIR Right 03/04/2020   Procedure: ECTROPION REPAIR, EXTENSIVE RIGHT EYE CONJUNCTIVOPLASTY RIGHT EYE HIGH TEMP HANDHELD CAUTERY;  Surgeon: Imagene Riches, MD;  Location: [facility];  Service: Ophthalmology;  Laterality: Right;   PARATHYROIDECTOMY N/A 08/29/2020   Procedure: PARATHYROIDECTOMY with RNFA to assist;  Surgeon: Duanne Guess, MD;  Location: [facility];  Service: General;  Laterality: N/A;    Social History   Socioeconomic History   Marital status: Widowed    Spouse name: Not on file   Number of children: Not on file   Years of education: Not on file   Highest education level: Not on file  Occupational History   Not on file  Tobacco Use   Smoking status: Former    Current packs/day: 0.00    Average packs/day: 1 pack/day for 15.0 years (15.0 ttl pk-yrs)    Types: Cigarettes    Start date: 38    Quit date: 79    Years since quitting: 29.8   Smokeless tobacco: Never  Vaping Use   Vaping status: Never Used  Substance and Sexual Activity   Alcohol use: Not Currently   Drug  use: Never   Sexual activity: Not on file  Other Topics Concern   Not on file  Social History Narrative   Not on file   Social Determinants of Health   Financial Resource Strain: Not on file  Food Insecurity: Not on file  Transportation Needs: Not on file  Physical Activity: Not on file  Stress: Not on file  Social Connections: Not on file  Intimate Partner Violence: Not on file    Family History  Problem Relation Age of Onset   Breast cancer Sister    Heart failure Mother    Stomach cancer Father    Breast cancer Sister     Allergies  Allergen Reactions   Valium [Diazepam] Nausea And Vomiting    Review of Systems  Constitutional: Negative.  Negative for chills, fever, malaise/fatigue and weight loss.  HENT: Negative.  Negative for congestion, sinus pain and sore throat.   Eyes: Negative.   Respiratory: Negative.  Negative for cough, shortness of breath and wheezing.   Cardiovascular: Negative.  Negative for chest pain, palpitations and leg swelling.  Gastrointestinal: Negative.  Negative for abdominal pain, constipation, diarrhea, heartburn, nausea and vomiting.  Genitourinary: Negative.  Negative for dysuria and flank pain.  Musculoskeletal: Negative.  Negative for joint pain and myalgias.  Neurological: Negative.  Negative for dizziness and headaches.  Endo/Heme/Allergies: Negative.   Psychiatric/Behavioral: Negative.  Negative for depression and suicidal ideas. The patient is not nervous/anxious.        Objective:   BP 110/76   Pulse 73   Ht 5\' 5"  (1.651 m)   Wt 132 lb 9.6 oz (60.1 kg)   SpO2 99%   BMI 22.07 kg/m   Vitals:   05/28/23 1258  BP: 110/76  Pulse: 73  Height: 5\' 5"  (1.651 m)  Weight: 132 lb 9.6 oz (60.1 kg)  SpO2: 99%  BMI (Calculated): 22.07    Physical Exam Vitals and nursing note reviewed.  Constitutional:      Appearance: Normal appearance.  HENT:     Head: Normocephalic and atraumatic.     Nose: Nose normal.      Mouth/Throat:     Mouth: Mucous membranes are moist.     Pharynx: Oropharynx is clear.  Eyes:     Conjunctiva/sclera: Conjunctivae normal.     Pupils: Pupils are equal, round, and reactive to light.  Cardiovascular:     Rate and Rhythm: Normal rate and regular rhythm.     Pulses: Normal pulses.     Heart sounds: Normal heart sounds. No murmur heard. Pulmonary:     Effort: Pulmonary effort is normal.     Breath sounds: Normal breath sounds. No wheezing.  Abdominal:     General: Bowel sounds are normal.     Palpations: Abdomen is soft.     Tenderness: There is no abdominal tenderness. There is no right CVA tenderness or left CVA tenderness.  Musculoskeletal:        General: Normal range of motion.     Cervical back: Normal range of motion.     Right lower leg: No edema.     Left lower leg: No edema.  Skin:    General: Skin is warm and dry.  Neurological:     General: No focal deficit present.     Mental Status: She is alert and oriented to person, place, and time.  Psychiatric:        Mood and Affect: Mood normal.        Behavior: Behavior normal.      No results found for any visits on 05/28/23.  No results found for this or any previous visit (from the past 2160 hour(s)).    Assessment & Plan:  Continue all medications. Fasting labs. Flu vaccine. Needs AWV. Problem List Items Addressed This Visit     Essential hypertension, benign - Primary   Relevant Orders   CMP14+EGFR   Mixed hyperlipidemia   Relevant Orders   Lipid Panel w/o Chol/HDL Ratio   Gastroesophageal reflux disease without esophagitis   Relevant Orders   CBC with Diff   Prediabetes   Relevant Orders   Hemoglobin A1c   Other Visit Diagnoses     Need for immunization against influenza       Relevant Orders   Flu Vaccine Trivalent High Dose (Fluad)   Colon cancer screening       Relevant Orders   Cologuard   Screening for osteoporosis       Relevant Orders   DG Bone Density        Return in about 1 month (around 06/27/2023).   Total time spent: 30 minutes  Margaretann Loveless, MD  05/28/2023   This document may have been prepared by [facility] Voice Recognition software  and as such may include unintentional dictation errors.

## 2023-05-29 LAB — CBC WITH DIFFERENTIAL/PLATELET
Basophils Absolute: 0 10*3/uL (ref 0.0–0.2)
Basos: 1 %
EOS (ABSOLUTE): 0.1 10*3/uL (ref 0.0–0.4)
Eos: 3 %
Hematocrit: 37.5 % (ref 34.0–46.6)
Hemoglobin: 12.6 g/dL (ref 11.1–15.9)
Immature Grans (Abs): 0 10*3/uL (ref 0.0–0.1)
Immature Granulocytes: 0 %
Lymphocytes Absolute: 2.1 10*3/uL (ref 0.7–3.1)
Lymphs: 47 %
MCH: 28.1 pg (ref 26.6–33.0)
MCHC: 33.6 g/dL (ref 31.5–35.7)
MCV: 84 fL (ref 79–97)
Monocytes Absolute: 0.5 10*3/uL (ref 0.1–0.9)
Monocytes: 11 %
Neutrophils Absolute: 1.6 10*3/uL (ref 1.4–7.0)
Neutrophils: 38 %
Platelets: 324 10*3/uL (ref 150–450)
RBC: 4.49 x10E6/uL (ref 3.77–5.28)
RDW: 13.5 % (ref 11.7–15.4)
WBC: 4.3 10*3/uL (ref 3.4–10.8)

## 2023-05-29 LAB — CMP14+EGFR
ALT: 11 [IU]/L (ref 0–32)
AST: 20 [IU]/L (ref 0–40)
Albumin: 4.4 g/dL (ref 3.7–4.7)
Alkaline Phosphatase: 60 [IU]/L (ref 44–121)
BUN/Creatinine Ratio: 13 (ref 12–28)
BUN: 13 mg/dL (ref 8–27)
Bilirubin Total: 0.5 mg/dL (ref 0.0–1.2)
CO2: 27 mmol/L (ref 20–29)
Calcium: 9.9 mg/dL (ref 8.7–10.3)
Chloride: 100 mmol/L (ref 96–106)
Creatinine, Ser: 1.03 mg/dL — ABNORMAL HIGH (ref 0.57–1.00)
Globulin, Total: 3 g/dL (ref 1.5–4.5)
Glucose: 82 mg/dL (ref 70–99)
Potassium: 3.9 mmol/L (ref 3.5–5.2)
Sodium: 144 mmol/L (ref 134–144)
Total Protein: 7.4 g/dL (ref 6.0–8.5)
eGFR: 55 mL/min/{1.73_m2} — ABNORMAL LOW (ref >59)

## 2023-05-29 LAB — LIPID PANEL W/O CHOL/HDL RATIO
Cholesterol, Total: 171 mg/dL (ref 100–199)
HDL: 69 mg/dL (ref >39)
LDL Chol Calc (NIH): 85 mg/dL (ref 0–99)
Triglycerides: 91 mg/dL (ref 0–149)
VLDL Cholesterol Cal: 17 mg/dL (ref 5–40)

## 2023-05-29 LAB — HEMOGLOBIN A1C
Est. average glucose Bld gHb Est-mCnc: 123 mg/dL
Hgb A1c MFr Bld: 5.9 % — ABNORMAL HIGH (ref 4.8–5.6)

## 2023-06-19 DIAGNOSIS — Z1211 Encounter for screening for malignant neoplasm of colon: Secondary | ICD-10-CM | POA: Diagnosis not present

## 2023-06-26 LAB — COLOGUARD: COLOGUARD: POSITIVE — AB

## 2023-06-30 ENCOUNTER — Other Ambulatory Visit: Payer: Self-pay | Admitting: Family

## 2023-06-30 DIAGNOSIS — R195 Other fecal abnormalities: Secondary | ICD-10-CM

## 2023-07-02 ENCOUNTER — Other Ambulatory Visit: Payer: Medicare HMO

## 2023-07-02 ENCOUNTER — Ambulatory Visit: Payer: Medicare HMO | Admitting: Internal Medicine

## 2023-07-03 ENCOUNTER — Ambulatory Visit (INDEPENDENT_AMBULATORY_CARE_PROVIDER_SITE_OTHER): Payer: Medicare HMO

## 2023-07-03 DIAGNOSIS — Z13820 Encounter for screening for osteoporosis: Secondary | ICD-10-CM

## 2023-07-03 DIAGNOSIS — M8589 Other specified disorders of bone density and structure, multiple sites: Secondary | ICD-10-CM

## 2023-07-19 ENCOUNTER — Other Ambulatory Visit: Payer: Self-pay | Admitting: Internal Medicine

## 2023-07-19 DIAGNOSIS — R195 Other fecal abnormalities: Secondary | ICD-10-CM

## 2023-08-12 DIAGNOSIS — Z860100 Personal history of colon polyps, unspecified: Secondary | ICD-10-CM | POA: Diagnosis not present

## 2023-08-12 DIAGNOSIS — R195 Other fecal abnormalities: Secondary | ICD-10-CM | POA: Diagnosis not present

## 2023-08-27 ENCOUNTER — Ambulatory Visit: Payer: Medicare HMO

## 2023-08-27 DIAGNOSIS — D126 Benign neoplasm of colon, unspecified: Secondary | ICD-10-CM | POA: Diagnosis not present

## 2023-08-27 DIAGNOSIS — D120 Benign neoplasm of cecum: Secondary | ICD-10-CM | POA: Diagnosis not present

## 2023-08-27 DIAGNOSIS — Z1211 Encounter for screening for malignant neoplasm of colon: Secondary | ICD-10-CM | POA: Diagnosis not present

## 2023-08-27 DIAGNOSIS — D122 Benign neoplasm of ascending colon: Secondary | ICD-10-CM | POA: Diagnosis not present

## 2023-08-27 DIAGNOSIS — K5730 Diverticulosis of large intestine without perforation or abscess without bleeding: Secondary | ICD-10-CM | POA: Diagnosis not present

## 2023-08-27 DIAGNOSIS — D123 Benign neoplasm of transverse colon: Secondary | ICD-10-CM | POA: Diagnosis not present

## 2023-08-27 DIAGNOSIS — K640 First degree hemorrhoids: Secondary | ICD-10-CM | POA: Diagnosis not present

## 2023-08-27 DIAGNOSIS — R195 Other fecal abnormalities: Secondary | ICD-10-CM | POA: Diagnosis not present

## 2023-09-27 ENCOUNTER — Encounter: Payer: Self-pay | Admitting: Internal Medicine

## 2023-09-27 ENCOUNTER — Ambulatory Visit (INDEPENDENT_AMBULATORY_CARE_PROVIDER_SITE_OTHER): Admitting: Internal Medicine

## 2023-09-27 VITALS — BP 126/78 | HR 71 | Ht 65.0 in | Wt 132.8 lb

## 2023-09-27 DIAGNOSIS — K219 Gastro-esophageal reflux disease without esophagitis: Secondary | ICD-10-CM

## 2023-09-27 DIAGNOSIS — I10 Essential (primary) hypertension: Secondary | ICD-10-CM | POA: Diagnosis not present

## 2023-09-27 DIAGNOSIS — E559 Vitamin D deficiency, unspecified: Secondary | ICD-10-CM | POA: Diagnosis not present

## 2023-09-27 DIAGNOSIS — R7303 Prediabetes: Secondary | ICD-10-CM | POA: Diagnosis not present

## 2023-09-27 DIAGNOSIS — E782 Mixed hyperlipidemia: Secondary | ICD-10-CM | POA: Diagnosis not present

## 2023-09-27 DIAGNOSIS — L821 Other seborrheic keratosis: Secondary | ICD-10-CM

## 2023-09-27 NOTE — Progress Notes (Signed)
 Established Patient Office Visit  Subjective:  Patient ID: Cristina Martinez, female    DOB: 1940/10/10  Age: 83 y.o. MRN: [id number]  Chief Complaint  Patient presents with   Follow-up    3 month follow up, discuss lab results    Patient comes in for her follow-up today.  She is generally feeling well.  However is concerned about hair fall.  There is also a prominent seborrheic keratosis lesion on right temple area of her forehead.  Will refer for dermatology for possible excision. Denies any chest pain no shortness of breath and no palpitations.  Blood pressure is under good control.    No other concerns at this time.   Past Medical History:  Diagnosis Date   Arthritis    Headache    H/O MIGRAINES   Hypercalcemia    Hyperlipidemia    Hypertension    PONV (postoperative nausea and vomiting)    Wears dentures    full upper, partial lower    Past Surgical History:  Procedure Laterality Date   ABDOMINAL HYSTERECTOMY     CATARACT EXTRACTION Right    COLONOSCOPY     ECTROPION REPAIR Right 03/04/2020   Procedure: ECTROPION REPAIR, EXTENSIVE RIGHT EYE CONJUNCTIVOPLASTY RIGHT EYE HIGH TEMP HANDHELD CAUTERY;  Surgeon: Imagene Riches, MD;  Location: [facility];  Service: Ophthalmology;  Laterality: Right;   PARATHYROIDECTOMY N/A 08/29/2020   Procedure: PARATHYROIDECTOMY with RNFA to assist;  Surgeon: Duanne Guess, MD;  Location: [facility];  Service: General;  Laterality: N/A;    Social History   Socioeconomic History   Marital status: Widowed    Spouse name: Not on file   Number of children: Not on file   Years of education: Not on file   Highest education level: Not on file  Occupational History   Not on file  Tobacco Use   Smoking status: Former    Current packs/day: 0.00    Average packs/day: 1 pack/day for 15.0 years (15.0 ttl pk-yrs)    Types: Cigarettes    Start date: 22    Quit date: 30    Years since quitting: 30.2   Smokeless tobacco: Never   Vaping Use   Vaping status: Never Used  Substance and Sexual Activity   Alcohol use: Not Currently   Drug use: Never   Sexual activity: Not on file  Other Topics Concern   Not on file  Social History Narrative   Not on file   Social Drivers of Health   Financial Resource Strain: Not on file  Food Insecurity: Not on file  Transportation Needs: Not on file  Physical Activity: Not on file  Stress: Not on file  Social Connections: Not on file  Intimate Partner Violence: Not on file    Family History  Problem Relation Age of Onset   Breast cancer Sister    Heart failure Mother    Stomach cancer Father    Breast cancer Sister     Allergies  Allergen Reactions   Valium [Diazepam] Nausea And Vomiting    Outpatient Medications Prior to Visit  Medication Sig   alendronate (FOSAMAX) 70 MG tablet Take 70 mg by mouth every Thursday.   amLODipine (NORVASC) 10 MG tablet TAKE 1 TABLET EVERY DAY   aspirin 81 MG EC tablet Take 81 mg by mouth daily.   calcium-vitamin D (OSCAL 500/200 D-3) 500-200 MG-UNIT tablet Take 1 tablet by mouth 3 (three) times daily.   hydrochlorothiazide (HYDRODIURIL) 25 MG tablet TAKE  1 TABLET EVERY DAY   ibuprofen (ADVIL) 800 MG tablet Take 1 tablet (800 mg total) by mouth every 8 (eight) hours as needed.   pantoprazole (PROTONIX) 40 MG tablet TAKE 1 TABLET EVERY DAY   potassium chloride (KLOR-CON 10) 10 MEQ tablet Take 1 tablet (10 mEq total) by mouth daily.   simvastatin (ZOCOR) 20 MG tablet TAKE 1 TABLET EVERY DAY   Vitamin D, Ergocalciferol, (DRISDOL) 1.25 MG (50000 UNIT) CAPS capsule Take 50,000 Units by mouth every 7 (seven) days. Monday   acetaminophen (TYLENOL) 500 MG tablet Take 1,000 mg by mouth every 6 (six) hours as needed for moderate pain.   [DISCONTINUED] benzonatate (TESSALON PERLES) 100 MG capsule Take 1 capsule (100 mg total) by mouth 3 (three) times daily as needed for cough. (Patient not taking: Reported on 05/28/2023)   No  facility-administered medications prior to visit.    Review of Systems  Constitutional: Negative.  Negative for chills, diaphoresis, fever, malaise/fatigue and weight loss.  HENT: Negative.  Negative for hearing loss, sinus pain and sore throat.   Eyes: Negative.   Respiratory: Negative.  Negative for cough and shortness of breath.   Cardiovascular: Negative.  Negative for chest pain, palpitations and leg swelling.  Gastrointestinal: Negative.  Negative for abdominal pain, constipation, diarrhea, heartburn, nausea and vomiting.  Genitourinary: Negative.  Negative for dysuria and flank pain.  Musculoskeletal: Negative.  Negative for joint pain and myalgias.  Skin: Negative.   Neurological: Negative.  Negative for dizziness and headaches.  Endo/Heme/Allergies: Negative.   Psychiatric/Behavioral: Negative.  Negative for depression and suicidal ideas. The patient is not nervous/anxious.        Objective:   BP 126/78   Pulse 71   Ht 5\' 5"  (1.651 m)   Wt 132 lb 12.8 oz (60.2 kg)   SpO2 95%   BMI 22.10 kg/m   Vitals:   09/27/23 1311  BP: 126/78  Pulse: 71  Height: 5\' 5"  (1.651 m)  Weight: 132 lb 12.8 oz (60.2 kg)  SpO2: 95%  BMI (Calculated): 22.1    Physical Exam Vitals and nursing note reviewed.  Constitutional:      Appearance: Normal appearance.  HENT:     Head: Normocephalic and atraumatic.     Nose: Nose normal.     Mouth/Throat:     Mouth: Mucous membranes are moist.     Pharynx: Oropharynx is clear.  Eyes:     Conjunctiva/sclera: Conjunctivae normal.     Pupils: Pupils are equal, round, and reactive to light.  Cardiovascular:     Rate and Rhythm: Normal rate and regular rhythm.     Pulses: Normal pulses.     Heart sounds: Normal heart sounds. No murmur heard. Pulmonary:     Effort: Pulmonary effort is normal.     Breath sounds: Normal breath sounds. No wheezing.  Abdominal:     General: Bowel sounds are normal.     Palpations: Abdomen is soft.      Tenderness: There is no abdominal tenderness. There is no right CVA tenderness or left CVA tenderness.  Musculoskeletal:        General: Normal range of motion.     Cervical back: Normal range of motion.     Right lower leg: No edema.     Left lower leg: No edema.  Skin:    General: Skin is warm and dry.  Neurological:     General: No focal deficit present.     Mental Status: She is alert  and oriented to person, place, and time.  Psychiatric:        Mood and Affect: Mood normal.        Behavior: Behavior normal.      No results found for any visits on 09/27/23.  No results found for this or any previous visit (from the past 2160 hours).    Assessment & Plan:  Dermatology referral for seborrheic keratosis of the face.  Her hair loss can also be addressed at the same time.  Check blood work today. Problem List Items Addressed This Visit     Essential hypertension, benign - Primary   Relevant Orders   CMP14+EGFR   Mixed hyperlipidemia   Relevant Orders   Lipid Panel w/o Chol/HDL Ratio   Gastroesophageal reflux disease without esophagitis   Relevant Orders   CBC with Diff   Prediabetes   Other Visit Diagnoses       Vitamin D deficiency       Relevant Orders   Vitamin D (25 hydroxy)     Seborrheic keratoses       Relevant Orders   Ambulatory referral to Dermatology       Return in about 4 months (around 01/27/2024).   Total time spent: 30 minutes  Margaretann Loveless, MD  09/27/2023   This document may have been prepared by [facility] Voice Recognition software and as such may include unintentional dictation errors.

## 2023-10-02 LAB — CMP14+EGFR
ALT: 34 IU/L — ABNORMAL HIGH (ref 0–32)
AST: 37 IU/L (ref 0–40)
Albumin: 4.1 g/dL (ref 3.7–4.7)
Alkaline Phosphatase: 104 IU/L (ref 44–121)
BUN/Creatinine Ratio: 16 (ref 12–28)
BUN: 20 mg/dL (ref 8–27)
Bilirubin Total: <0.2 mg/dL (ref 0.0–1.2)
Calcium: 9.3 mg/dL (ref 8.7–10.3)
Chloride: 110 mmol/L — ABNORMAL HIGH (ref 96–106)
Creatinine, Ser: 1.23 mg/dL — ABNORMAL HIGH (ref 0.57–1.00)
Globulin, Total: 2.2 g/dL (ref 1.5–4.5)
Glucose: 128 mg/dL — ABNORMAL HIGH (ref 70–99)
Potassium: 4.2 mmol/L (ref 3.5–5.2)
Sodium: 145 mmol/L — ABNORMAL HIGH (ref 134–144)
Total Protein: 6.3 g/dL (ref 6.0–8.5)
eGFR: 44 mL/min/{1.73_m2} — ABNORMAL LOW (ref >59)

## 2023-10-02 LAB — CBC WITH DIFFERENTIAL/PLATELET
Basophils Absolute: 0.1 10*3/uL (ref 0.0–0.2)
Basos: 1 %
EOS (ABSOLUTE): 0.1 10*3/uL (ref 0.0–0.4)
Eos: 1 %
Hematocrit: 40.2 % (ref 34.0–46.6)
Hemoglobin: 12.9 g/dL (ref 11.1–15.9)
Immature Grans (Abs): 0 10*3/uL (ref 0.0–0.1)
Immature Granulocytes: 0 %
Lymphocytes Absolute: 1.4 10*3/uL (ref 0.7–3.1)
Lymphs: 18 %
MCH: 26.6 pg (ref 26.6–33.0)
MCHC: 32.1 g/dL (ref 31.5–35.7)
MCV: 83 fL (ref 79–97)
Monocytes Absolute: 0.6 10*3/uL (ref 0.1–0.9)
Monocytes: 8 %
Neutrophils Absolute: 6 10*3/uL (ref 1.4–7.0)
Neutrophils: 72 %
Platelets: 344 10*3/uL (ref 150–450)
RBC: 4.85 x10E6/uL (ref 3.77–5.28)
RDW: 14.6 % (ref 11.7–15.4)
WBC: 8.2 10*3/uL (ref 3.4–10.8)

## 2023-10-02 LAB — VITAMIN D 25 HYDROXY (VIT D DEFICIENCY, FRACTURES): Vit D, 25-Hydroxy: 23.6 ng/mL — ABNORMAL LOW (ref 30.0–100.0)

## 2023-10-02 LAB — LIPID PANEL W/O CHOL/HDL RATIO
Cholesterol, Total: 148 mg/dL (ref 100–199)
HDL: 53 mg/dL (ref >39)
LDL Chol Calc (NIH): 70 mg/dL (ref 0–99)
Triglycerides: 145 mg/dL (ref 0–149)
VLDL Cholesterol Cal: 25 mg/dL (ref 5–40)

## 2023-10-09 NOTE — Progress Notes (Signed)
 Patient notified

## 2023-12-02 ENCOUNTER — Other Ambulatory Visit: Payer: Self-pay | Admitting: Internal Medicine

## 2023-12-02 DIAGNOSIS — K219 Gastro-esophageal reflux disease without esophagitis: Secondary | ICD-10-CM

## 2023-12-23 DIAGNOSIS — L821 Other seborrheic keratosis: Secondary | ICD-10-CM | POA: Diagnosis not present

## 2023-12-23 DIAGNOSIS — R208 Other disturbances of skin sensation: Secondary | ICD-10-CM | POA: Diagnosis not present

## 2023-12-23 DIAGNOSIS — L820 Inflamed seborrheic keratosis: Secondary | ICD-10-CM | POA: Diagnosis not present

## 2023-12-23 DIAGNOSIS — L2989 Other pruritus: Secondary | ICD-10-CM | POA: Diagnosis not present

## 2023-12-23 DIAGNOSIS — D485 Neoplasm of uncertain behavior of skin: Secondary | ICD-10-CM | POA: Diagnosis not present

## 2024-01-27 ENCOUNTER — Ambulatory Visit (INDEPENDENT_AMBULATORY_CARE_PROVIDER_SITE_OTHER): Admitting: Internal Medicine

## 2024-01-27 ENCOUNTER — Encounter: Payer: Self-pay | Admitting: Internal Medicine

## 2024-01-27 VITALS — BP 116/78 | HR 80 | Ht 65.0 in | Wt 134.2 lb

## 2024-01-27 DIAGNOSIS — I10 Essential (primary) hypertension: Secondary | ICD-10-CM

## 2024-01-27 DIAGNOSIS — R7303 Prediabetes: Secondary | ICD-10-CM

## 2024-01-27 DIAGNOSIS — E782 Mixed hyperlipidemia: Secondary | ICD-10-CM

## 2024-01-27 DIAGNOSIS — K219 Gastro-esophageal reflux disease without esophagitis: Secondary | ICD-10-CM | POA: Diagnosis not present

## 2024-01-27 NOTE — Progress Notes (Signed)
 Established Patient Office Visit  Subjective:  Patient ID: Cristina Martinez, female    DOB: 1940/10/10  Age: 83 y.o. MRN: [id number]  Chief Complaint  Patient presents with   Follow-up    4 month follow up    Patient comes in for follow-up.  She feels very well and has no new complaints.  Denies chest pain, no shortness of breath no palpitations.  No complaints of nausea or vomiting, no diarrhea and no constipation.  Her mammogram is due this month.  Will check fasting labs.    No other concerns at this time.   Past Medical History:  Diagnosis Date   Arthritis    Headache    H/O MIGRAINES   Hypercalcemia    Hyperlipidemia    Hypertension    PONV (postoperative nausea and vomiting)    Wears dentures    full upper, partial lower    Past Surgical History:  Procedure Laterality Date   ABDOMINAL HYSTERECTOMY     CATARACT EXTRACTION Right    COLONOSCOPY     ECTROPION REPAIR Right 03/04/2020   Procedure: ECTROPION REPAIR, EXTENSIVE RIGHT EYE CONJUNCTIVOPLASTY RIGHT EYE HIGH TEMP HANDHELD CAUTERY;  Surgeon: Ashley Greig HERO, MD;  Location: [facility];  Service: Ophthalmology;  Laterality: Right;   PARATHYROIDECTOMY N/A 08/29/2020   Procedure: PARATHYROIDECTOMY with RNFA to assist;  Surgeon: Marolyn Nest, MD;  Location: [facility];  Service: General;  Laterality: N/A;    Social History   Socioeconomic History   Marital status: Widowed    Spouse name: Not on file   Number of children: Not on file   Years of education: Not on file   Highest education level: Not on file  Occupational History   Not on file  Tobacco Use   Smoking status: Former    Current packs/day: 0.00    Average packs/day: 1 pack/day for 15.0 years (15.0 ttl pk-yrs)    Types: Cigarettes    Start date: 67    Quit date: 34    Years since quitting: 30.5   Smokeless tobacco: Never  Vaping Use   Vaping status: Never Used  Substance and Sexual Activity   Alcohol use: Not Currently   Drug use:  Never   Sexual activity: Not on file  Other Topics Concern   Not on file  Social History Narrative   Not on file   Social Drivers of Health   Financial Resource Strain: Not on file  Food Insecurity: Not on file  Transportation Needs: Not on file  Physical Activity: Not on file  Stress: Not on file  Social Connections: Not on file  Intimate Partner Violence: Not on file    Family History  Problem Relation Age of Onset   Breast cancer Sister    Heart failure Mother    Stomach cancer Father    Breast cancer Sister     Allergies  Allergen Reactions   Valium [Diazepam] Nausea And Vomiting    Outpatient Medications Prior to Visit  Medication Sig   alendronate (FOSAMAX) 70 MG tablet Take 70 mg by mouth every Thursday.   amLODipine (NORVASC) 10 MG tablet TAKE 1 TABLET EVERY DAY   aspirin 81 MG EC tablet Take 81 mg by mouth daily.   calcium-vitamin D  (OSCAL 500/200 D-3) 500-200 MG-UNIT tablet Take 1 tablet by mouth 3 (three) times daily.   hydrochlorothiazide  (HYDRODIURIL ) 25 MG tablet TAKE 1 TABLET EVERY DAY   ibuprofen  (ADVIL ) 800 MG tablet Take 1 tablet (800 mg  total) by mouth every 8 (eight) hours as needed.   pantoprazole  (PROTONIX ) 40 MG tablet TAKE 1 TABLET EVERY DAY   potassium chloride  (KLOR-CON  10) 10 MEQ tablet Take 1 tablet (10 mEq total) by mouth daily.   simvastatin (ZOCOR) 20 MG tablet TAKE 1 TABLET EVERY DAY   Vitamin D , Ergocalciferol , (DRISDOL) 1.25 MG (50000 UNIT) CAPS capsule Take 50,000 Units by mouth every 7 (seven) days. Monday   No facility-administered medications prior to visit.    Review of Systems  Constitutional: Negative.  Negative for chills, diaphoresis, fever, malaise/fatigue and weight loss.  HENT: Negative.  Negative for congestion and sore throat.   Eyes: Negative.   Respiratory: Negative.  Negative for cough and shortness of breath.   Cardiovascular: Negative.  Negative for chest pain, palpitations and leg swelling.  Gastrointestinal:  Negative.  Negative for abdominal pain, constipation, diarrhea, heartburn, nausea and vomiting.  Genitourinary: Negative.  Negative for dysuria and flank pain.  Musculoskeletal: Negative.  Negative for joint pain and myalgias.  Skin: Negative.   Neurological: Negative.  Negative for dizziness, tingling, tremors, sensory change and headaches.  Endo/Heme/Allergies: Negative.   Psychiatric/Behavioral: Negative.  Negative for depression and suicidal ideas. The patient is not nervous/anxious.        Objective:   BP 116/78   Pulse 80   Ht 5' 5 (1.651 m)   Wt 134 lb 3.2 oz (60.9 kg)   SpO2 98%   BMI 22.33 kg/m   Vitals:   01/27/24 1255  BP: 116/78  Pulse: 80  Height: 5' 5 (1.651 m)  Weight: 134 lb 3.2 oz (60.9 kg)  SpO2: 98%  BMI (Calculated): 22.33    Physical Exam Vitals and nursing note reviewed.  Constitutional:      Appearance: Normal appearance.  HENT:     Head: Normocephalic and atraumatic.     Nose: Nose normal.     Mouth/Throat:     Mouth: Mucous membranes are moist.     Pharynx: Oropharynx is clear.  Eyes:     Conjunctiva/sclera: Conjunctivae normal.     Pupils: Pupils are equal, round, and reactive to light.  Cardiovascular:     Rate and Rhythm: Normal rate and regular rhythm.     Pulses: Normal pulses.     Heart sounds: Normal heart sounds. No murmur heard. Pulmonary:     Effort: Pulmonary effort is normal.     Breath sounds: Normal breath sounds. No wheezing.  Abdominal:     General: Bowel sounds are normal.     Palpations: Abdomen is soft.     Tenderness: There is no abdominal tenderness. There is no right CVA tenderness or left CVA tenderness.  Musculoskeletal:        General: Normal range of motion.     Cervical back: Normal range of motion.     Right lower leg: No edema.     Left lower leg: No edema.  Skin:    General: Skin is warm and dry.  Neurological:     General: No focal deficit present.     Mental Status: She is alert and oriented to  person, place, and time.  Psychiatric:        Mood and Affect: Mood normal.        Behavior: Behavior normal.      No results found for any visits on 01/27/24.  No results found for this or any previous visit (from the past 2160 hours).    Assessment & Plan:  Continue  current medications.  Check blood work. Problem List Items Addressed This Visit     Essential hypertension, benign - Primary   Relevant Orders   CMP14+EGFR   Mixed hyperlipidemia   Relevant Orders   Lipid Panel w/o Chol/HDL Ratio   Gastroesophageal reflux disease without esophagitis   Relevant Orders   CBC with Diff   Prediabetes   Relevant Orders   Hemoglobin A1c    Return in about 4 months (around 05/29/2024).   Total time spent: 30 minutes  FERNAND FREDY RAMAN, MD  01/27/2024   This document may have been prepared by [facility] Voice Recognition software and as such may include unintentional dictation errors.

## 2024-01-28 ENCOUNTER — Ambulatory Visit: Payer: Self-pay | Admitting: Internal Medicine

## 2024-01-28 LAB — CBC WITH DIFFERENTIAL/PLATELET
Basophils Absolute: 0 x10E3/uL (ref 0.0–0.2)
Basos: 1 %
EOS (ABSOLUTE): 0.1 x10E3/uL (ref 0.0–0.4)
Eos: 2 %
Hematocrit: 38.1 % (ref 34.0–46.6)
Hemoglobin: 12.8 g/dL (ref 11.1–15.9)
Immature Grans (Abs): 0 x10E3/uL (ref 0.0–0.1)
Immature Granulocytes: 0 %
Lymphocytes Absolute: 2 x10E3/uL (ref 0.7–3.1)
Lymphs: 46 %
MCH: 29.1 pg (ref 26.6–33.0)
MCHC: 33.6 g/dL (ref 31.5–35.7)
MCV: 87 fL (ref 79–97)
Monocytes Absolute: 0.4 x10E3/uL (ref 0.1–0.9)
Monocytes: 9 %
Neutrophils Absolute: 1.8 x10E3/uL (ref 1.4–7.0)
Neutrophils: 42 %
Platelets: 357 x10E3/uL (ref 150–450)
RBC: 4.4 x10E6/uL (ref 3.77–5.28)
RDW: 13.7 % (ref 11.7–15.4)
WBC: 4.4 x10E3/uL (ref 3.4–10.8)

## 2024-01-28 LAB — LIPID PANEL W/O CHOL/HDL RATIO
Cholesterol, Total: 162 mg/dL (ref 100–199)
HDL: 68 mg/dL (ref >39)
LDL Chol Calc (NIH): 79 mg/dL (ref 0–99)
Triglycerides: 79 mg/dL (ref 0–149)
VLDL Cholesterol Cal: 15 mg/dL (ref 5–40)

## 2024-01-28 LAB — CMP14+EGFR
ALT: 11 IU/L (ref 0–32)
AST: 19 IU/L (ref 0–40)
Albumin: 4.4 g/dL (ref 3.7–4.7)
Alkaline Phosphatase: 55 IU/L (ref 44–121)
BUN/Creatinine Ratio: 14 (ref 12–28)
BUN: 14 mg/dL (ref 8–27)
Bilirubin Total: 0.6 mg/dL (ref 0.0–1.2)
CO2: 24 mmol/L (ref 20–29)
Calcium: 9.3 mg/dL (ref 8.7–10.3)
Chloride: 96 mmol/L (ref 96–106)
Creatinine, Ser: 1.01 mg/dL — ABNORMAL HIGH (ref 0.57–1.00)
Globulin, Total: 2.9 g/dL (ref 1.5–4.5)
Glucose: 115 mg/dL — ABNORMAL HIGH (ref 70–99)
Potassium: 3.4 mmol/L — ABNORMAL LOW (ref 3.5–5.2)
Sodium: 138 mmol/L (ref 134–144)
Total Protein: 7.3 g/dL (ref 6.0–8.5)
eGFR: 56 mL/min/1.73 — ABNORMAL LOW (ref >59)

## 2024-01-28 LAB — HEMOGLOBIN A1C
Est. average glucose Bld gHb Est-mCnc: 131 mg/dL
Hgb A1c MFr Bld: 6.2 % — ABNORMAL HIGH (ref 4.8–5.6)

## 2024-01-28 NOTE — Progress Notes (Signed)
 Patient notified

## 2024-01-30 ENCOUNTER — Other Ambulatory Visit: Payer: Self-pay | Admitting: Internal Medicine

## 2024-01-30 DIAGNOSIS — I10 Essential (primary) hypertension: Secondary | ICD-10-CM

## 2024-02-03 ENCOUNTER — Other Ambulatory Visit: Payer: Self-pay | Admitting: Internal Medicine

## 2024-02-03 DIAGNOSIS — Z1231 Encounter for screening mammogram for malignant neoplasm of breast: Secondary | ICD-10-CM

## 2024-02-14 ENCOUNTER — Ambulatory Visit
Admission: RE | Admit: 2024-02-14 | Discharge: 2024-02-14 | Disposition: A | Discharge status: Home / Self Care | Source: Ambulatory Visit | Attending: Internal Medicine | Admitting: Internal Medicine

## 2024-02-14 DIAGNOSIS — Z1231 Encounter for screening mammogram for malignant neoplasm of breast: Secondary | ICD-10-CM | POA: Insufficient documentation

## 2024-02-19 ENCOUNTER — Ambulatory Visit: Payer: Self-pay | Admitting: Cardiology

## 2024-02-19 NOTE — Progress Notes (Signed)
 [facility]

## 2024-02-20 NOTE — Progress Notes (Signed)
 [facility]

## 2024-03-16 ENCOUNTER — Other Ambulatory Visit: Payer: Self-pay | Admitting: Internal Medicine

## 2024-03-20 ENCOUNTER — Other Ambulatory Visit: Payer: Self-pay | Admitting: Internal Medicine

## 2024-05-29 ENCOUNTER — Ambulatory Visit: Admitting: Internal Medicine

## 2024-06-01 ENCOUNTER — Ambulatory Visit (INDEPENDENT_AMBULATORY_CARE_PROVIDER_SITE_OTHER): Admitting: Internal Medicine

## 2024-06-01 ENCOUNTER — Encounter: Payer: Self-pay | Admitting: Internal Medicine

## 2024-06-01 VITALS — BP 136/68 | HR 72 | Ht 65.0 in | Wt 135.2 lb

## 2024-06-01 DIAGNOSIS — I10 Essential (primary) hypertension: Secondary | ICD-10-CM | POA: Diagnosis not present

## 2024-06-01 DIAGNOSIS — E782 Mixed hyperlipidemia: Secondary | ICD-10-CM | POA: Diagnosis not present

## 2024-06-01 DIAGNOSIS — R7303 Prediabetes: Secondary | ICD-10-CM | POA: Diagnosis not present

## 2024-06-01 DIAGNOSIS — M7501 Adhesive capsulitis of right shoulder: Secondary | ICD-10-CM | POA: Diagnosis not present

## 2024-06-01 DIAGNOSIS — G8929 Other chronic pain: Secondary | ICD-10-CM | POA: Diagnosis not present

## 2024-06-01 DIAGNOSIS — M25511 Pain in right shoulder: Secondary | ICD-10-CM

## 2024-06-01 DIAGNOSIS — Z23 Encounter for immunization: Secondary | ICD-10-CM | POA: Diagnosis not present

## 2024-06-01 DIAGNOSIS — E559 Vitamin D deficiency, unspecified: Secondary | ICD-10-CM | POA: Insufficient documentation

## 2024-06-01 MED ORDER — CELECOXIB 200 MG PO CAPS: 200.0000 mg | ORAL_CAPSULE | Freq: Every day | ORAL | 2 refills | Status: DC

## 2024-06-01 NOTE — Progress Notes (Signed)
 Established Patient Office Visit  Subjective:  Patient ID: Cristina Martinez, female    DOB: 10/24/1940  Age: 83 y.o. MRN: [id number]  Chief Complaint  Patient presents with   Follow-up    4 month follow up    Patient comes in for follow up today. She is generally feeling well, but mentions some discomfort and reduced ROM of right shoulder over last few weeks. She takes otc Ibuprofen  200 mg/d-  Will send in rx for Celebrex 200 mg/d, advised home ROM exercises. Will consider xray and PT if not better at follow up. Patient is fasting for labs , and will get flu shot today.    No other concerns at this time.   Past Medical History:  Diagnosis Date   Arthritis    Headache    H/O MIGRAINES   Hypercalcemia    Hyperlipidemia    Hypertension    PONV (postoperative nausea and vomiting)    Wears dentures    full upper, partial lower    Past Surgical History:  Procedure Laterality Date   ABDOMINAL HYSTERECTOMY     CATARACT EXTRACTION Right    COLONOSCOPY     ECTROPION REPAIR Right 03/04/2020   Procedure: ECTROPION REPAIR, EXTENSIVE RIGHT EYE CONJUNCTIVOPLASTY RIGHT EYE HIGH TEMP HANDHELD CAUTERY;  Surgeon: Ashley Greig HERO, MD;  Location: [facility];  Service: Ophthalmology;  Laterality: Right;   PARATHYROIDECTOMY N/A 08/29/2020   Procedure: PARATHYROIDECTOMY with RNFA to assist;  Surgeon: Marolyn Nest, MD;  Location: [facility];  Service: General;  Laterality: N/A;    Social History   Socioeconomic History   Marital status: Widowed    Spouse name: Not on file   Number of children: Not on file   Years of education: Not on file   Highest education level: Not on file  Occupational History   Not on file  Tobacco Use   Smoking status: Former    Current packs/day: 0.00    Average packs/day: 1 pack/day for 15.0 years (15.0 ttl pk-yrs)    Types: Cigarettes    Start date: 77    Quit date: 44    Years since quitting: 30.8   Smokeless tobacco: Never  Vaping Use    Vaping status: Never Used  Substance and Sexual Activity   Alcohol use: Not Currently   Drug use: Never   Sexual activity: Not on file  Other Topics Concern   Not on file  Social History Narrative   Not on file   Social Drivers of Health   Financial Resource Strain: Not on file  Food Insecurity: Not on file  Transportation Needs: Not on file  Physical Activity: Not on file  Stress: Not on file  Social Connections: Not on file  Intimate Partner Violence: Not on file    Family History  Problem Relation Age of Onset   Breast cancer Sister    Heart failure Mother    Stomach cancer Father    Breast cancer Sister     Allergies  Allergen Reactions   Valium [Diazepam] Nausea And Vomiting    Outpatient Medications Prior to Visit  Medication Sig   alendronate (FOSAMAX) 70 MG tablet Take 70 mg by mouth every Thursday.   amLODipine (NORVASC) 10 MG tablet TAKE 1 TABLET EVERY DAY   aspirin 81 MG EC tablet Take 81 mg by mouth daily.   calcium-vitamin D  (OSCAL 500/200 D-3) 500-200 MG-UNIT tablet Take 1 tablet by mouth 3 (three) times daily. (Patient taking differently: Take 1  tablet by mouth daily with breakfast.)   hydrochlorothiazide  (HYDRODIURIL ) 25 MG tablet TAKE 1 TABLET EVERY DAY   pantoprazole  (PROTONIX ) 40 MG tablet TAKE 1 TABLET EVERY DAY   potassium chloride  (KLOR-CON  10) 10 MEQ tablet Take 1 tablet (10 mEq total) by mouth daily.   simvastatin (ZOCOR) 20 MG tablet TAKE 1 TABLET EVERY DAY   Vitamin D , Ergocalciferol , (DRISDOL) 1.25 MG (50000 UNIT) CAPS capsule Take 50,000 Units by mouth every 7 (seven) days. Monday   [DISCONTINUED] ibuprofen  (ADVIL ) 800 MG tablet Take 1 tablet (800 mg total) by mouth every 8 (eight) hours as needed.   No facility-administered medications prior to visit.    Review of Systems  Constitutional: Negative.  Negative for chills, fever and malaise/fatigue.  HENT: Negative.  Negative for congestion and sore throat.   Eyes: Negative.  Negative for  blurred vision and pain.  Respiratory: Negative.  Negative for cough and shortness of breath.   Cardiovascular: Negative.  Negative for chest pain, palpitations and leg swelling.  Gastrointestinal: Negative.  Negative for abdominal pain, blood in stool, constipation, diarrhea, heartburn, melena, nausea and vomiting.  Genitourinary: Negative.  Negative for dysuria, flank pain, frequency and urgency.  Musculoskeletal:  Positive for joint pain (Right shoulder). Negative for myalgias.  Skin: Negative.   Neurological: Negative.  Negative for dizziness, tingling, sensory change, weakness and headaches.  Endo/Heme/Allergies: Negative.   Psychiatric/Behavioral: Negative.  Negative for depression and suicidal ideas. The patient is not nervous/anxious.        Objective:   BP 136/68   Pulse 72   Ht 5' 5 (1.651 m)   Wt 135 lb 3.2 oz (61.3 kg)   SpO2 98%   BMI 22.50 kg/m   Vitals:   06/01/24 1033  BP: 136/68  Pulse: 72  Height: 5' 5 (1.651 m)  Weight: 135 lb 3.2 oz (61.3 kg)  SpO2: 98%  BMI (Calculated): 22.5    Physical Exam Vitals and nursing note reviewed.  Constitutional:      Appearance: Normal appearance.  HENT:     Head: Normocephalic and atraumatic.     Nose: Nose normal.     Mouth/Throat:     Mouth: Mucous membranes are moist.     Pharynx: Oropharynx is clear.  Eyes:     Conjunctiva/sclera: Conjunctivae normal.     Pupils: Pupils are equal, round, and reactive to light.  Cardiovascular:     Rate and Rhythm: Normal rate and regular rhythm.     Pulses: Normal pulses.     Heart sounds: Normal heart sounds. No murmur heard. Pulmonary:     Effort: Pulmonary effort is normal.     Breath sounds: Normal breath sounds. No wheezing.  Abdominal:     General: Bowel sounds are normal.     Palpations: Abdomen is soft.     Tenderness: There is no abdominal tenderness. There is no right CVA tenderness or left CVA tenderness.  Musculoskeletal:        General: Normal range of  motion.     Cervical back: Normal range of motion.     Right lower leg: No edema.     Left lower leg: No edema.  Skin:    General: Skin is warm and dry.  Neurological:     General: No focal deficit present.     Mental Status: She is alert and oriented to person, place, and time.  Psychiatric:        Mood and Affect: Mood normal.  Behavior: Behavior normal.      No results found for any visits on 06/01/24.  No results found for this or any previous visit (from the past 2160 hours).    Assessment & Plan:  Celebrex for right shoulder. ROM exercises. Check labs. Problem List Items Addressed This Visit     Essential hypertension, benign - Primary   Relevant Orders   CMP14+EGFR   Mixed hyperlipidemia   Relevant Orders   Lipid Panel w/o Chol/HDL Ratio   Prediabetes   Relevant Orders   Hemoglobin A1c   Vitamin D  deficiency   Relevant Orders   Vitamin D  (25 hydroxy)   Other Visit Diagnoses       Chronic right shoulder pain       Relevant Medications   celecoxib (CELEBREX) 200 MG capsule     Needs flu shot       Relevant Orders   Flu Vaccine Trivalent High Dose (Fluad) (Completed)     Adhesive capsulitis of right shoulder       Relevant Medications   celecoxib (CELEBREX) 200 MG capsule       Return in about 1 month (around 07/01/2024).   Total time spent: 30 minutes. This time includes review of previous notes and results and patient face to face interaction during today's visit.    FERNAND FREDY RAMAN, MD  06/01/2024   This document may have been prepared by [facility] Voice Recognition software and as such may include unintentional dictation errors.

## 2024-06-02 ENCOUNTER — Ambulatory Visit: Payer: Self-pay | Admitting: Internal Medicine

## 2024-06-02 LAB — VITAMIN D 25 HYDROXY (VIT D DEFICIENCY, FRACTURES): Vit D, 25-Hydroxy: 44.5 ng/mL (ref 30.0–100.0)

## 2024-06-02 LAB — CMP14+EGFR
ALT: 9 IU/L (ref 0–32)
AST: 16 IU/L (ref 0–40)
Albumin: 4.3 g/dL (ref 3.7–4.7)
Alkaline Phosphatase: 51 IU/L (ref 48–129)
BUN/Creatinine Ratio: 17 (ref 12–28)
BUN: 18 mg/dL (ref 8–27)
Bilirubin Total: 0.6 mg/dL (ref 0.0–1.2)
CO2: 29 mmol/L (ref 20–29)
Calcium: 9.7 mg/dL (ref 8.7–10.3)
Chloride: 98 mmol/L (ref 96–106)
Creatinine, Ser: 1.04 mg/dL — ABNORMAL HIGH (ref 0.57–1.00)
Globulin, Total: 2.8 g/dL (ref 1.5–4.5)
Glucose: 91 mg/dL (ref 70–99)
Potassium: 3.2 mmol/L — ABNORMAL LOW (ref 3.5–5.2)
Sodium: 141 mmol/L (ref 134–144)
Total Protein: 7.1 g/dL (ref 6.0–8.5)
eGFR: 54 mL/min/1.73 — ABNORMAL LOW (ref >59)

## 2024-06-02 LAB — LIPID PANEL W/O CHOL/HDL RATIO
Cholesterol, Total: 177 mg/dL (ref 100–199)
HDL: 78 mg/dL (ref >39)
LDL Chol Calc (NIH): 87 mg/dL (ref 0–99)
Triglycerides: 65 mg/dL (ref 0–149)
VLDL Cholesterol Cal: 12 mg/dL (ref 5–40)

## 2024-06-02 LAB — HEMOGLOBIN A1C
Est. average glucose Bld gHb Est-mCnc: 120 mg/dL
Hgb A1c MFr Bld: 5.8 % — ABNORMAL HIGH (ref 4.8–5.6)

## 2024-06-09 NOTE — Progress Notes (Signed)
Pt informed

## 2024-07-06 ENCOUNTER — Ambulatory Visit: Admitting: Internal Medicine

## 2024-07-06 ENCOUNTER — Encounter: Payer: Self-pay | Admitting: Internal Medicine

## 2024-07-06 VITALS — BP 128/72 | HR 80 | Ht 65.0 in | Wt 134.4 lb

## 2024-07-06 DIAGNOSIS — E559 Vitamin D deficiency, unspecified: Secondary | ICD-10-CM | POA: Diagnosis not present

## 2024-07-06 DIAGNOSIS — K219 Gastro-esophageal reflux disease without esophagitis: Secondary | ICD-10-CM | POA: Diagnosis not present

## 2024-07-06 DIAGNOSIS — E782 Mixed hyperlipidemia: Secondary | ICD-10-CM

## 2024-07-06 DIAGNOSIS — R7303 Prediabetes: Secondary | ICD-10-CM | POA: Diagnosis not present

## 2024-07-06 DIAGNOSIS — Z0001 Encounter for general adult medical examination with abnormal findings: Secondary | ICD-10-CM

## 2024-07-06 DIAGNOSIS — Z0000 Encounter for general adult medical examination without abnormal findings: Secondary | ICD-10-CM | POA: Insufficient documentation

## 2024-07-06 DIAGNOSIS — I10 Essential (primary) hypertension: Secondary | ICD-10-CM

## 2024-07-06 DIAGNOSIS — M7501 Adhesive capsulitis of right shoulder: Secondary | ICD-10-CM | POA: Insufficient documentation

## 2024-07-06 DIAGNOSIS — E876 Hypokalemia: Secondary | ICD-10-CM | POA: Insufficient documentation

## 2024-07-06 NOTE — Progress Notes (Signed)
 "  Established Patient Office Visit  Subjective:  Patient ID: Cristina Martinez, female    DOB: [birth year]  Age: 83 y.o. MRN: [id number]  Chief Complaint  Patient presents with   Annual Exam    AWV    Patient is here today for Medicare AWV.  She reports feeling well today and has no concerns to discuss at this time. She states her right shoulder discomfort has improved since doing ROM exercises daily. She state she has not needed to start Celebrex  and stopped using OTC NSAIDs as well.   She reports taking her medications as prescribed without side effects. Last colonoscopy was 08/2023; mammogram completed 02/2024; DEXA scan 06/2023. Patient would like to continue bone density imaging but states she would like to hold off on continuing mammogram screenings.  PHQ-9 score 2; GAD-7 score 0; 6CIT 0. Denies chest pain, shortness of breath, palpitations, abdominal distress at this time.    No other concerns at this time.   Past Medical History:  Diagnosis Date   Arthritis    Headache    H/O MIGRAINES   Hypercalcemia    Hyperlipidemia    Hypertension    PONV (postoperative nausea and vomiting)    Wears dentures    full upper, partial lower    Past Surgical History:  Procedure Laterality Date   ABDOMINAL HYSTERECTOMY     CATARACT EXTRACTION Right    COLONOSCOPY     ECTROPION REPAIR Right 03/04/2020   Procedure: ECTROPION REPAIR, EXTENSIVE RIGHT EYE CONJUNCTIVOPLASTY RIGHT EYE HIGH TEMP HANDHELD CAUTERY;  Surgeon: Ashley Greig HERO, MD;  Location: [facility];  Service: Ophthalmology;  Laterality: Right;   PARATHYROIDECTOMY N/A 08/29/2020   Procedure: PARATHYROIDECTOMY with RNFA to assist;  Surgeon: Marolyn Nest, MD;  Location: [facility];  Service: General;  Laterality: N/A;    Social History   Socioeconomic History   Marital status: Widowed    Spouse name: Not on file   Number of children: Not on file   Years of education: Not on file   Highest education level: Not on  file  Occupational History   Not on file  Tobacco Use   Smoking status: Former    Current packs/day: 0.00    Average packs/day: 1 pack/day for 15.0 years (15.0 ttl pk-yrs)    Types: Cigarettes    Start date: 14    Quit date: 27    Years since quitting: 30.9   Smokeless tobacco: Never  Vaping Use   Vaping status: Never Used  Substance and Sexual Activity   Alcohol use: Not Currently   Drug use: Never   Sexual activity: Not on file  Other Topics Concern   Not on file  Social History Narrative   Not on file   Social Drivers of Health   Tobacco Use: Medium Risk (07/06/2024)   Patient History    Smoking Tobacco Use: Former    Smokeless Tobacco Use: Never    Passive Exposure: Not on Actuary Strain: Not on file  Food Insecurity: Not on file  Transportation Needs: Not on file  Physical Activity: Not on file  Stress: Not on file  Social Connections: Not on file  Intimate Partner Violence: Not on file  Depression (PHQ2-9): Low Risk (09/27/2023)   Depression (PHQ2-9)    PHQ-2 Score: 3  Alcohol Screen: Not on file  Housing: Unknown (08/09/2023)   Received from [facility]   Epic    Unable to Pay for Housing in  the Last Year: Not on file    Number of Times Moved in the Last Year: Not on file    At any time in the past 12 months, were you homeless or living in a shelter (including now)?: No  Utilities: Not on file  Health Literacy: Not on file    Family History  Problem Relation Age of Onset   Breast cancer Sister    Heart failure Mother    Stomach cancer Father    Breast cancer Sister     Allergies[1]  Show/hide medication list[2]  Review of Systems  Constitutional: Negative.  Negative for chills, fever and malaise/fatigue.  HENT: Negative.  Negative for congestion and sore throat.   Eyes: Negative.  Negative for blurred vision and pain.  Respiratory: Negative.  Negative for cough and shortness of breath.   Cardiovascular:  Negative.  Negative for chest pain, palpitations and leg swelling.  Gastrointestinal: Negative.  Negative for abdominal pain, blood in stool, constipation, diarrhea, heartburn, melena, nausea and vomiting.  Genitourinary: Negative.  Negative for dysuria, flank pain, frequency and urgency.  Musculoskeletal: Negative.  Negative for joint pain and myalgias.  Skin: Negative.   Neurological: Negative.  Negative for dizziness, tingling, sensory change, weakness and headaches.  Endo/Heme/Allergies: Negative.   Psychiatric/Behavioral: Negative.  Negative for depression and suicidal ideas. The patient is not nervous/anxious.        Objective:   BP 128/72   Pulse 80   Ht 5' 5 (1.651 m)   Wt 134 lb 6.4 oz (61 kg)   SpO2 98%   BMI 22.37 kg/m   Vitals:   07/06/24 1044  BP: 128/72  Pulse: 80  Height: 5' 5 (1.651 m)  Weight: 134 lb 6.4 oz (61 kg)  SpO2: 98%  BMI (Calculated): 22.37    Physical Exam Vitals and nursing note reviewed.  Constitutional:      Appearance: Normal appearance.  HENT:     Head: Normocephalic and atraumatic.     Nose: Nose normal.     Mouth/Throat:     Mouth: Mucous membranes are moist.     Pharynx: Oropharynx is clear.  Eyes:     Conjunctiva/sclera: Conjunctivae normal.     Pupils: Pupils are equal, round, and reactive to light.  Cardiovascular:     Rate and Rhythm: Normal rate and regular rhythm.     Pulses: Normal pulses.     Heart sounds: Normal heart sounds. No murmur heard. Pulmonary:     Effort: Pulmonary effort is normal.     Breath sounds: Normal breath sounds. No wheezing.  Abdominal:     General: Bowel sounds are normal.     Palpations: Abdomen is soft.     Tenderness: There is no abdominal tenderness. There is no right CVA tenderness or left CVA tenderness.  Musculoskeletal:        General: Normal range of motion.     Cervical back: Normal range of motion.     Right lower leg: No edema.     Left lower leg: No edema.  Skin:    General:  Skin is warm and dry.  Neurological:     General: No focal deficit present.     Mental Status: She is alert and oriented to person, place, and time.  Psychiatric:        Mood and Affect: Mood normal.        Behavior: Behavior normal.      No results found for any visits on 07/06/24.  Recent Results (from the past 2160 hours)  CMP14+EGFR     Status: Abnormal   Collection Time: 06/01/24 11:20 AM  Result Value Ref Range   Glucose 91 70 - 99 mg/dL   BUN 18 8 - 27 mg/dL   Creatinine, Ser 8.95 (H) 0.57 - 1.00 mg/dL   eGFR 54 (L) >40 fO/fpw/8.26   BUN/Creatinine Ratio 17 12 - 28   Sodium 141 134 - 144 mmol/L   Potassium 3.2 (L) 3.5 - 5.2 mmol/L   Chloride 98 96 - 106 mmol/L   CO2 29 20 - 29 mmol/L   Calcium 9.7 8.7 - 10.3 mg/dL   Total Protein 7.1 6.0 - 8.5 g/dL   Albumin 4.3 3.7 - 4.7 g/dL   Globulin, Total 2.8 1.5 - 4.5 g/dL   Bilirubin Total 0.6 0.0 - 1.2 mg/dL   Alkaline Phosphatase 51 48 - 129 IU/L   AST 16 0 - 40 IU/L   ALT 9 0 - 32 IU/L  Lipid Panel w/o Chol/HDL Ratio     Status: None   Collection Time: 06/01/24 11:20 AM  Result Value Ref Range   Cholesterol, Total 177 100 - 199 mg/dL   Triglycerides 65 0 - 149 mg/dL   HDL 78 >60 mg/dL   VLDL Cholesterol Cal 12 5 - 40 mg/dL   LDL Chol Calc (NIH) 87 0 - 99 mg/dL  Vitamin D  (25 hydroxy)     Status: None   Collection Time: 06/01/24 11:20 AM  Result Value Ref Range   Vit D, 25-Hydroxy 44.5 30.0 - 100.0 ng/mL    Comment: Vitamin D  deficiency has been defined by the Institute of Medicine and an Endocrine Society practice guideline as a level of serum 25-OH vitamin D  less than 20 ng/mL (1,2). The Endocrine Society went on to further define vitamin D  insufficiency as a level between 21 and 29 ng/mL (2). 1. IOM (Institute of Medicine). 2010. Dietary reference    intakes for calcium and D. [place]: The    [employer]. 2. Holick MF, Binkley Aguanga, Bischoff-Ferrari HA, et al.    Evaluation, treatment, and  prevention of vitamin D     deficiency: an Endocrine Society clinical practice    guideline. JCEM. 2011 Jul; 96(7):1911-30.   Hemoglobin A1c     Status: Abnormal   Collection Time: 06/01/24 11:20 AM  Result Value Ref Range   Hgb A1c MFr Bld 5.8 (H) 4.8 - 5.6 %    Comment:          Prediabetes: 5.7 - 6.4          Diabetes: >6.4          Glycemic control for adults with diabetes: <7.0    Est. average glucose Bld gHb Est-mCnc 120 mg/dL      Assessment & Plan:  Continue medications as prescribed. Check BMP today to FU on hypokalemia. Reinforce healthy diet and drinking plenty of water daily.  Problem List Items Addressed This Visit       Cardiovascular and Mediastinum   Essential hypertension, benign     Digestive   Gastroesophageal reflux disease without esophagitis     Musculoskeletal and Integument   Adhesive capsulitis of right shoulder     Other   Mixed hyperlipidemia   Prediabetes   Vitamin D  deficiency   Hypokalemia   Relevant Orders   Basic metabolic panel with GFR   Medicare annual wellness visit, subsequent - Primary    Return in about 2 months (around  09/06/2024).   Total time spent: 30 minutes. This time includes review of previous notes and results and patient face to face interaction during today's visit.    FERNAND FREDY RAMAN, MD  07/06/2024   This document may have been prepared by [facility] Voice Recognition software and as such may include unintentional dictation errors.     [1]  Allergies Allergen Reactions   Valium [Diazepam] Nausea And Vomiting  [2]  Outpatient Medications Prior to Visit  Medication Sig   alendronate (FOSAMAX) 70 MG tablet Take 70 mg by mouth every Thursday.   amLODipine (NORVASC) 10 MG tablet TAKE 1 TABLET EVERY DAY   aspirin 81 MG EC tablet Take 81 mg by mouth daily.   calcium-vitamin D  (OSCAL 500/200 D-3) 500-200 MG-UNIT tablet Take 1 tablet by mouth 3 (three) times daily. (Patient taking differently: Take 1 tablet by mouth  daily with breakfast.)   celecoxib  (CELEBREX ) 200 MG capsule Take 1 capsule (200 mg total) by mouth daily.   hydrochlorothiazide  (HYDRODIURIL ) 25 MG tablet TAKE 1 TABLET EVERY DAY   pantoprazole  (PROTONIX ) 40 MG tablet TAKE 1 TABLET EVERY DAY   potassium chloride  (KLOR-CON  10) 10 MEQ tablet Take 1 tablet (10 mEq total) by mouth daily.   simvastatin (ZOCOR) 20 MG tablet TAKE 1 TABLET EVERY DAY   Vitamin D , Ergocalciferol , (DRISDOL) 1.25 MG (50000 UNIT) CAPS capsule Take 50,000 Units by mouth every 7 (seven) days. Monday   No facility-administered medications prior to visit.   "

## 2024-07-07 ENCOUNTER — Ambulatory Visit: Payer: Self-pay | Admitting: Internal Medicine

## 2024-07-07 LAB — BASIC METABOLIC PANEL WITH GFR
BUN/Creatinine Ratio: 15 (ref 12–28)
BUN: 20 mg/dL (ref 8–27)
CO2: 21 mmol/L (ref 20–29)
Calcium: 10.2 mg/dL (ref 8.7–10.3)
Chloride: 99 mmol/L (ref 96–106)
Creatinine, Ser: 1.31 mg/dL — ABNORMAL HIGH (ref 0.57–1.00)
Glucose: 131 mg/dL — ABNORMAL HIGH (ref 70–99)
Potassium: 4.5 mmol/L (ref 3.5–5.2)
Sodium: 138 mmol/L (ref 134–144)
eGFR: 41 mL/min/1.73 — ABNORMAL LOW

## 2024-07-07 NOTE — Progress Notes (Signed)
 Patient notified

## 2024-08-10 NOTE — Progress Notes (Signed)
 Cristina Martinez                                          MRN: [id number]   08/10/2024   The [facility] Quality Team Specialist reviewed this patient medical record for the purposes of chart review for care gap closure. The following were reviewed: abstraction for care gap closure-controlling blood pressure.    [facility] Quality Team

## 2024-08-17 ENCOUNTER — Other Ambulatory Visit: Payer: Self-pay | Admitting: Internal Medicine

## 2024-08-17 DIAGNOSIS — G8929 Other chronic pain: Secondary | ICD-10-CM

## 2024-09-07 ENCOUNTER — Ambulatory Visit: Admitting: Internal Medicine
